# Patient Record
Sex: Female | Born: 1990
Health system: Southern US, Community
[De-identification: ages and names within clinical notes are randomized; demographics above are authoritative.]

## PROBLEM LIST (undated history)

## (undated) DIAGNOSIS — E282 Polycystic ovarian syndrome: Secondary | ICD-10-CM

## (undated) DIAGNOSIS — B029 Zoster without complications: Secondary | ICD-10-CM

## (undated) DIAGNOSIS — J45909 Unspecified asthma, uncomplicated: Secondary | ICD-10-CM

## (undated) DIAGNOSIS — F419 Anxiety disorder, unspecified: Secondary | ICD-10-CM

## (undated) DIAGNOSIS — G932 Benign intracranial hypertension: Secondary | ICD-10-CM

## (undated) DIAGNOSIS — F32A Depression, unspecified: Secondary | ICD-10-CM

## (undated) DIAGNOSIS — T7840XA Allergy, unspecified, initial encounter: Secondary | ICD-10-CM

## (undated) DIAGNOSIS — G4730 Sleep apnea, unspecified: Secondary | ICD-10-CM

## (undated) HISTORY — DX: Unspecified asthma, uncomplicated: J45.909

## (undated) HISTORY — DX: Sleep apnea, unspecified: G47.30

## (undated) HISTORY — PX: LUMBAR PUNCTURE: SHX1985

## (undated) HISTORY — PX: OTHER SURGICAL HISTORY: SHX169

## (undated) HISTORY — DX: Zoster without complications: B02.9

## (undated) HISTORY — DX: Polycystic ovarian syndrome: E28.2

## (undated) HISTORY — DX: Allergy, unspecified, initial encounter: T78.40XA

---

## 2013-05-02 ENCOUNTER — Ambulatory Visit (INDEPENDENT_AMBULATORY_CARE_PROVIDER_SITE_OTHER): Payer: BC Managed Care – PPO | Admitting: Podiatry

## 2013-05-02 ENCOUNTER — Ambulatory Visit (INDEPENDENT_AMBULATORY_CARE_PROVIDER_SITE_OTHER): Payer: BC Managed Care – PPO

## 2013-05-02 ENCOUNTER — Encounter: Payer: Self-pay | Admitting: Podiatry

## 2013-05-02 VITALS — BP 92/64 | HR 82 | Resp 16 | Ht 69.0 in | Wt 220.0 lb

## 2013-05-02 DIAGNOSIS — R52 Pain, unspecified: Secondary | ICD-10-CM

## 2013-05-02 MED ORDER — MELOXICAM 7.5 MG PO TABS: 7.5000 mg | ORAL_TABLET | Freq: Every day | ORAL | Status: DC

## 2013-05-02 MED ORDER — METHYLPREDNISOLONE (PAK) 4 MG PO TABS: ORAL_TABLET | ORAL | Status: DC

## 2013-05-02 NOTE — Patient Instructions (Addendum)
Plantar Fasciitis (Heel Spur Syndrome) with Rehab The plantar fascia is a fibrous, ligament-like, soft-tissue structure that spans the bottom of the foot. Plantar fasciitis is a condition that causes pain in the foot due to inflammation of the tissue. SYMPTOMS   Pain and tenderness on the underneath side of the foot.  Pain that worsens with standing or walking. CAUSES  Plantar fasciitis is caused by irritation and injury to the plantar fascia on the underneath side of the foot. Common mechanisms of injury include:  Direct trauma to bottom of the foot.  Damage to a small nerve that runs under the foot where the main fascia attaches to the heel bone.  Stress placed on the plantar fascia due to bone spurs. RISK INCREASES WITH:   Activities that place stress on the plantar fascia (running, jumping, pivoting, or cutting).  Poor strength and flexibility.  Improperly fitted shoes.  Tight calf muscles.  Flat feet.  Failure to warm-up properly before activity.  Obesity. PREVENTION  Warm up and stretch properly before activity.  Allow for adequate recovery between workouts.  Maintain physical fitness:  Strength, flexibility, and endurance.  Cardiovascular fitness.  Maintain a health body weight.  Avoid stress on the plantar fascia.  Wear properly fitted shoes, including arch supports for individuals who have flat feet. PROGNOSIS  If treated properly, then the symptoms of plantar fasciitis usually resolve without surgery. However, occasionally surgery is necessary. RELATED COMPLICATIONS   Recurrent symptoms that may result in a chronic condition.  Problems of the lower back that are caused by compensating for the injury, such as limping.  Pain or weakness of the foot during push-off following surgery.  Chronic inflammation, scarring, and partial or complete fascia tear, occurring more often from repeated injections. TREATMENT  Treatment initially involves the use of  ice and medication to help reduce pain and inflammation. The use of strengthening and stretching exercises may help reduce pain with activity, especially stretches of the Achilles tendon. These exercises may be performed at home or with a therapist. Your caregiver may recommend that you use heel cups of arch supports to help reduce stress on the plantar fascia. Occasionally, corticosteroid injections are given to reduce inflammation. If symptoms persist for greater than 6 months despite non-surgical (conservative), then surgery may be recommended.  MEDICATION   If pain medication is necessary, then nonsteroidal anti-inflammatory medications, such as aspirin and ibuprofen, or other minor pain relievers, such as acetaminophen, are often recommended.  Do not take pain medication within 7 days before surgery.  Prescription pain relievers may be given if deemed necessary by your caregiver. Use only as directed and only as much as you need.  Corticosteroid injections may be given by your caregiver. These injections should be reserved for the most serious cases, because they may only be given a certain number of times. HEAT AND COLD  Cold treatment (icing) relieves pain and reduces inflammation. Cold treatment should be applied for 10 to 15 minutes every 2 to 3 hours for inflammation and pain and immediately after any activity that aggravates your symptoms. Use ice packs or massage the area with a piece of ice (ice massage).  Heat treatment may be used prior to performing the stretching and strengthening activities prescribed by your caregiver, physical therapist, or athletic trainer. Use a heat pack or soak the injury in warm water. SEEK IMMEDIATE MEDICAL CARE IF:  Treatment seems to offer no benefit, or the condition worsens.  Any medications produce adverse side effects. EXERCISES RANGE   OF MOTION (ROM) AND STRETCHING EXERCISES - Plantar Fasciitis (Heel Spur Syndrome) These exercises may help you  when beginning to rehabilitate your injury. Your symptoms may resolve with or without further involvement from your physician, physical therapist or athletic trainer. While completing these exercises, remember:   Restoring tissue flexibility helps normal motion to return to the joints. This allows healthier, less painful movement and activity.  An effective stretch should be held for at least 30 seconds.  A stretch should never be painful. You should only feel a gentle lengthening or release in the stretched tissue. RANGE OF MOTION - Toe Extension, Flexion  Sit with your right / left leg crossed over your opposite knee.  Grasp your toes and gently pull them back toward the top of your foot. You should feel a stretch on the bottom of your toes and/or foot.  Hold this stretch for __________ seconds.  Now, gently pull your toes toward the bottom of your foot. You should feel a stretch on the top of your toes and or foot.  Hold this stretch for __________ seconds. Repeat __________ times. Complete this stretch __________ times per day.  RANGE OF MOTION - Ankle Dorsiflexion, Active Assisted  Remove shoes and sit on a chair that is preferably not on a carpeted surface.  Place right / left foot under knee. Extend your opposite leg for support.  Keeping your heel down, slide your right / left foot back toward the chair until you feel a stretch at your ankle or calf. If you do not feel a stretch, slide your bottom forward to the edge of the chair, while still keeping your heel down.  Hold this stretch for __________ seconds. Repeat __________ times. Complete this stretch __________ times per day.  STRETCH  Gastroc, Standing  Place hands on wall.  Extend right / left leg, keeping the front knee somewhat bent.  Slightly point your toes inward on your back foot.  Keeping your right / left heel on the floor and your knee straight, shift your weight toward the wall, not allowing your back to  arch.  You should feel a gentle stretch in the right / left calf. Hold this position for __________ seconds. Repeat __________ times. Complete this stretch __________ times per day. STRETCH  Soleus, Standing  Place hands on wall.  Extend right / left leg, keeping the other knee somewhat bent.  Slightly point your toes inward on your back foot.  Keep your right / left heel on the floor, bend your back knee, and slightly shift your weight over the back leg so that you feel a gentle stretch deep in your back calf.  Hold this position for __________ seconds. Repeat __________ times. Complete this stretch __________ times per day. STRETCH  Gastrocsoleus, Standing  Note: This exercise can place a lot of stress on your foot and ankle. Please complete this exercise only if specifically instructed by your caregiver.   Place the ball of your right / left foot on a step, keeping your other foot firmly on the same step.  Hold on to the wall or a rail for balance.  Slowly lift your other foot, allowing your body weight to press your heel down over the edge of the step.  You should feel a stretch in your right / left calf.  Hold this position for __________ seconds.  Repeat this exercise with a slight bend in your right / left knee. Repeat __________ times. Complete this stretch __________ times per day.    STRENGTHENING EXERCISES - Plantar Fasciitis (Heel Spur Syndrome)  These exercises may help you when beginning to rehabilitate your injury. They may resolve your symptoms with or without further involvement from your physician, physical therapist or athletic trainer. While completing these exercises, remember:   Muscles can gain both the endurance and the strength needed for everyday activities through controlled exercises.  Complete these exercises as instructed by your physician, physical therapist or athletic trainer. Progress the resistance and repetitions only as guided. STRENGTH - Towel  Curls  Sit in a chair positioned on a non-carpeted surface.  Place your foot on a towel, keeping your heel on the floor.  Pull the towel toward your heel by only curling your toes. Keep your heel on the floor.  If instructed by your physician, physical therapist or athletic trainer, add ____________________ at the end of the towel. Repeat __________ times. Complete this exercise __________ times per day. STRENGTH - Ankle Inversion  Secure one end of a rubber exercise band/tubing to a fixed object (table, pole). Loop the other end around your foot just before your toes.  Place your fists between your knees. This will focus your strengthening at your ankle.  Slowly, pull your big toe up and in, making sure the band/tubing is positioned to resist the entire motion.  Hold this position for __________ seconds.  Have your muscles resist the band/tubing as it slowly pulls your foot back to the starting position. Repeat __________ times. Complete this exercises __________ times per day.  Document Released: 07/14/2005 Document Revised: 10/06/2011 Document Reviewed: 10/26/2008 [employer] Patient Information 2014 [employer]. Plantar Fasciitis Plantar fasciitis is a common condition that causes foot pain. It is soreness (inflammation) of the band of tough fibrous tissue on the bottom of the foot that runs from the heel bone (calcaneus) to the ball of the foot. The cause of this soreness may be from excessive standing, poor fitting shoes, running on hard surfaces, being overweight, having an abnormal walk, or overuse (this is common in runners) of the painful foot or feet. It is also common in aerobic exercise dancers and ballet dancers. SYMPTOMS  Most people with plantar fasciitis complain of:  Severe pain in the morning on the bottom of their foot especially when taking the first steps out of bed. This pain recedes after a few minutes of walking.  Severe pain is experienced also during walking  following a long period of inactivity.  Pain is worse when walking barefoot or up stairs DIAGNOSIS   Your caregiver will diagnose this condition by examining and feeling your foot.  Special tests such as X-rays of your foot, are usually not needed. PREVENTION   Consult a sports medicine professional before beginning a new exercise program.  Walking programs offer a good workout. With walking there is a lower chance of overuse injuries common to runners. There is less impact and less jarring of the joints.  Begin all new exercise programs slowly. If problems or pain develop, decrease the amount of time or distance until you are at a comfortable level.  Wear good shoes and replace them regularly.  Stretch your foot and the heel cords at the back of the ankle (Achilles tendon) both before and after exercise.  Run or exercise on even surfaces that are not hard. For example, asphalt is better than pavement.  Do not run barefoot on hard surfaces.  If using a treadmill, vary the incline.  Do not continue to workout if you have foot or joint   problems. Seek professional help if they do not improve. HOME CARE INSTRUCTIONS   Avoid activities that cause you pain until you recover.  Use ice or cold packs on the problem or painful areas after working out.  Only take over-the-counter or prescription medicines for pain, discomfort, or fever as directed by your caregiver.  Soft shoe inserts or athletic shoes with air or gel sole cushions may be helpful.  If problems continue or become more severe, consult a sports medicine caregiver or your own health care provider. Cortisone is a potent anti-inflammatory medication that may be injected into the painful area. You can discuss this treatment with your caregiver. MAKE SURE YOU:   Understand these instructions.  Will watch your condition.  Will get help right away if you are not doing well or get worse. Document Released: 04/08/2001 Document  Revised: 10/06/2011 Document Reviewed: 06/07/2008 [employer] Patient Information 2014 [employer].  

## 2013-05-02 NOTE — Progress Notes (Signed)
Kharizma presents today with a chief complaint of a painful left foot. States it is primarily in the second metatarsophalangeal joint area and as of late has radiated proximally to the left heel. States this occurred in late August, after having pain in her right foot at that time. Most recently her pain is primarily in the left heel at about the second metatarsophalangeal joint area. Denies any trauma to the left foot though she does admit to a contusion to the right foot. 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  Objective: I have reviewed her past medical history medications and allergies. Vascular status is intact with pulses palpable to the bilateral lower extremities, capillary fill time is immediate, no edema, no ecchymosis. Neurologic sensorium is intact grossly. Deep tendon reflexes are intact. Muscle strength is intact +5 over 5 dorsiflexors plantar flexors inverters everters and all intrinsic musculature. Orthopedic evaluation demonstrates all joints distal to the ankle, full range of motion without crepitation. She has pain on palpation and in range of motion of the second metatarsophalangeal joint left foot. No pain on palpation of the metatarsal. She does have pain on palpation of the plantar fascia at the plantar fascial calcaneal insertion site. Radiographic evaluation demonstrates all joints appear to be normal normal osseous architecture is present. Soft tissue increase in density does demonstrate some fluid retention indicators triangle and an anterior ankle area and some soft tissue edema is also present. She does have soft tissue increase in density at the plantar fat fascial calcaneal insertion site. Dermatologic evaluation demonstrates supple while hydrated cutis, no erythema edema saline is drainage or odor no preoperative lesions.  Assessment: Plantar fasciitis left, possibly compensatory from the  right foot trauma. Capsulitis of the second metatarsophalangeal joint left. Mild peroneal tendinopathy left.  Plan: Injected the left heel today with Kenalog and local anesthetic to the point of maximal tenderness left heel. Plantar fascial strapping was applied. We dispensed a night splint. Discussed appropriate shoe gear stretching exercises ice therapy and shoe gear modifications. Instructions first stretching were given. A prescription for Medrol dose pack to be followed by [facility]. And I will followup with her in one month. Due to her activities I think orthotics might be best for her.

## 2013-05-02 NOTE — Progress Notes (Signed)
N dull ache to sharp pain  L left 2/3 met to across top of foot to arch to heel pain D end of august O all of sudden  C little better  A anytime T ice soaks

## 2014-01-18 DIAGNOSIS — M722 Plantar fascial fibromatosis: Secondary | ICD-10-CM

## 2014-01-20 ENCOUNTER — Ambulatory Visit (INDEPENDENT_AMBULATORY_CARE_PROVIDER_SITE_OTHER): Payer: Managed Care, Other (non HMO) | Admitting: Podiatry

## 2014-01-20 ENCOUNTER — Encounter: Payer: Self-pay | Admitting: Podiatry

## 2014-01-20 VITALS — BP 113/66 | HR 85 | Resp 16

## 2014-01-20 DIAGNOSIS — M722 Plantar fascial fibromatosis: Secondary | ICD-10-CM

## 2014-01-20 DIAGNOSIS — M7750 Other enthesopathy of unspecified foot: Secondary | ICD-10-CM

## 2014-01-20 DIAGNOSIS — M069 Rheumatoid arthritis, unspecified: Secondary | ICD-10-CM

## 2014-01-21 LAB — RHEUMATOID FACTOR: RHEUMATOID FACTOR: 7.7 [IU]/mL (ref 0.0–13.9)

## 2014-01-21 LAB — URIC ACID: Uric Acid: 4.2 mg/dL (ref 2.5–7.1)

## 2014-01-21 LAB — ANA: Anti Nuclear Antibody(ANA): POSITIVE — AB

## 2014-01-21 LAB — C-REACTIVE PROTEIN: CRP: 16.3 mg/L — AB (ref 0.0–4.9)

## 2014-01-21 LAB — SEDIMENTATION RATE: SED RATE: 3 mm/h (ref 0–32)

## 2014-01-21 NOTE — Progress Notes (Signed)
Subjective:     Patient ID: Carol Porter, female   DOB: August 20, 1990, 23 y.o.   MRN: [id number]  HPI patient states that her arches will flareup and get better and flareup to get better and she's not sure the problem and also seems to have other problems   Review of Systems     Objective:   Physical Exam Neurovascular status intact with moderate discomfort in the plantar fascia of both feet but no one specific area of pain with a wide distribution noted    Assessment:     Possibility for some form of plantar fasciitis versus the possibility for some form of systemic issue causing this condition    Plan:     H&P discussed and I do think long-term she can require orthotics with scans done today. I then went ahead and I am sending for blood work to rule out systemic pathology

## 2014-03-07 ENCOUNTER — Telehealth: Payer: Self-pay | Admitting: *Deleted

## 2014-03-07 NOTE — Telephone Encounter (Signed)
I never received my results from my last visit.  If I could have those mailed to me at my address that would be great.

## 2014-03-09 NOTE — Telephone Encounter (Signed)
Mailed labs to patient.

## 2014-03-14 ENCOUNTER — Ambulatory Visit (INDEPENDENT_AMBULATORY_CARE_PROVIDER_SITE_OTHER): Payer: 59 | Admitting: Podiatry

## 2014-03-14 VITALS — BP 126/72 | HR 75 | Resp 16

## 2014-03-14 DIAGNOSIS — M069 Rheumatoid arthritis, unspecified: Secondary | ICD-10-CM

## 2014-03-14 DIAGNOSIS — M7750 Other enthesopathy of unspecified foot: Secondary | ICD-10-CM

## 2014-03-15 NOTE — Progress Notes (Signed)
Subjective:     Patient ID: Carol Porter, female   DOB: 06-Dec-1990, 23 y.o.   MRN: [id number]  HPI patient states my foot feels some better but I'm here to review lab work and why I have so many different areas of discomfort   Review of Systems     Objective:   Physical Exam Neurovascular status intact with improvement within the foot noted currently with indications on blood work that there may be an inflammatory process    Assessment:     Explained the elevation of ANA and C-reactive protein and I would like her to see a rheumatologist with possibility for systemic inflammation    Plan:     She is to see Dr. and we will reevaluate her if pain persists. Did scanned for orthotics today to try to reduce plantar pain

## 2014-03-24 ENCOUNTER — Telehealth: Payer: Self-pay | Admitting: *Deleted

## 2014-03-24 NOTE — Telephone Encounter (Signed)
Pt called wanting lab results faxed over to her family doctor. Shelly faxed them to [phone].

## 2014-03-24 NOTE — Telephone Encounter (Signed)
I'm about to go see my primary care doctor and would like my lab values from my recent blood work.  It's the one that shows the RA.  If you could read off the results to me in a voicemail, if you can call me back that would be great.

## 2014-03-27 ENCOUNTER — Telehealth: Payer: Self-pay | Admitting: Podiatry

## 2014-03-27 NOTE — Telephone Encounter (Signed)
Notified patient that orthotics are in and scheduled appointment.

## 2014-03-31 ENCOUNTER — Ambulatory Visit (INDEPENDENT_AMBULATORY_CARE_PROVIDER_SITE_OTHER): Payer: 59 | Admitting: *Deleted

## 2014-03-31 DIAGNOSIS — M722 Plantar fascial fibromatosis: Secondary | ICD-10-CM

## 2014-03-31 NOTE — Patient Instructions (Signed)

## 2014-03-31 NOTE — Progress Notes (Signed)
Pt presents for orthotic pick up written and verbal instructions are giving

## 2014-05-05 ENCOUNTER — Ambulatory Visit: Payer: 59 | Admitting: Podiatry

## 2014-05-12 DIAGNOSIS — Z8719 Personal history of other diseases of the digestive system: Secondary | ICD-10-CM | POA: Insufficient documentation

## 2014-05-12 DIAGNOSIS — R1013 Epigastric pain: Secondary | ICD-10-CM | POA: Insufficient documentation

## 2014-06-02 ENCOUNTER — Ambulatory Visit: Payer: Self-pay | Admitting: Unknown Physician Specialty

## 2014-06-30 DIAGNOSIS — G43119 Migraine with aura, intractable, without status migrainosus: Secondary | ICD-10-CM | POA: Insufficient documentation

## 2014-11-20 LAB — SURGICAL PATHOLOGY

## 2015-08-03 DIAGNOSIS — F331 Major depressive disorder, recurrent, moderate: Secondary | ICD-10-CM | POA: Insufficient documentation

## 2015-08-03 DIAGNOSIS — K649 Unspecified hemorrhoids: Secondary | ICD-10-CM | POA: Insufficient documentation

## 2015-08-03 DIAGNOSIS — F419 Anxiety disorder, unspecified: Secondary | ICD-10-CM | POA: Insufficient documentation

## 2015-08-31 DIAGNOSIS — F5089 Other specified eating disorder: Secondary | ICD-10-CM | POA: Insufficient documentation

## 2015-10-22 DIAGNOSIS — F900 Attention-deficit hyperactivity disorder, predominantly inattentive type: Secondary | ICD-10-CM | POA: Insufficient documentation

## 2016-05-20 DIAGNOSIS — J4520 Mild intermittent asthma, uncomplicated: Secondary | ICD-10-CM | POA: Insufficient documentation

## 2017-10-01 ENCOUNTER — Other Ambulatory Visit: Payer: Self-pay

## 2017-10-01 ENCOUNTER — Emergency Department
Admission: EM | Admit: 2017-10-01 | Discharge: 2017-10-01 | Disposition: A | Discharge status: Home / Self Care | Payer: 59 | Attending: Emergency Medicine | Admitting: Emergency Medicine

## 2017-10-01 ENCOUNTER — Emergency Department: Payer: 59

## 2017-10-01 ENCOUNTER — Encounter: Payer: Self-pay | Admitting: Emergency Medicine

## 2017-10-01 DIAGNOSIS — R002 Palpitations: Secondary | ICD-10-CM | POA: Insufficient documentation

## 2017-10-01 DIAGNOSIS — R0602 Shortness of breath: Secondary | ICD-10-CM | POA: Insufficient documentation

## 2017-10-01 DIAGNOSIS — R42 Dizziness and giddiness: Secondary | ICD-10-CM | POA: Insufficient documentation

## 2017-10-01 DIAGNOSIS — R5383 Other fatigue: Secondary | ICD-10-CM

## 2017-10-01 DIAGNOSIS — R509 Fever, unspecified: Secondary | ICD-10-CM | POA: Diagnosis not present

## 2017-10-01 DIAGNOSIS — Z79899 Other long term (current) drug therapy: Secondary | ICD-10-CM | POA: Insufficient documentation

## 2017-10-01 DIAGNOSIS — J45909 Unspecified asthma, uncomplicated: Secondary | ICD-10-CM | POA: Insufficient documentation

## 2017-10-01 LAB — CBC WITH DIFFERENTIAL/PLATELET
BASOS ABS: 0 10*3/uL (ref 0–0.1)
BASOS PCT: 0 %
EOS ABS: 0.1 10*3/uL (ref 0–0.7)
EOS PCT: 1 %
HCT: 35.8 % (ref 35.0–47.0)
HEMOGLOBIN: 11.9 g/dL — AB (ref 12.0–16.0)
Lymphocytes Relative: 13 %
Lymphs Abs: 1.6 10*3/uL (ref 1.0–3.6)
MCH: 28.9 pg (ref 26.0–34.0)
MCHC: 33.3 g/dL (ref 32.0–36.0)
MCV: 86.7 fL (ref 80.0–100.0)
Monocytes Absolute: 0.9 10*3/uL (ref 0.2–0.9)
Monocytes Relative: 7 %
NEUTROS PCT: 79 %
Neutro Abs: 9.7 10*3/uL — ABNORMAL HIGH (ref 1.4–6.5)
PLATELETS: 257 10*3/uL (ref 150–440)
RBC: 4.13 MIL/uL (ref 3.80–5.20)
RDW: 13.5 % (ref 11.5–14.5)
WBC: 12.3 10*3/uL — ABNORMAL HIGH (ref 3.6–11.0)

## 2017-10-01 LAB — COMPREHENSIVE METABOLIC PANEL
ALT: 20 U/L (ref 14–54)
AST: 21 U/L (ref 15–41)
Albumin: 3.6 g/dL (ref 3.5–5.0)
Alkaline Phosphatase: 60 U/L (ref 38–126)
Anion gap: 11 (ref 5–15)
BILIRUBIN TOTAL: 0.5 mg/dL (ref 0.3–1.2)
BUN: 12 mg/dL (ref 6–20)
CHLORIDE: 103 mmol/L (ref 101–111)
CO2: 22 mmol/L (ref 22–32)
CREATININE: 0.72 mg/dL (ref 0.44–1.00)
Calcium: 8.4 mg/dL — ABNORMAL LOW (ref 8.9–10.3)
Glucose, Bld: 113 mg/dL — ABNORMAL HIGH (ref 65–99)
Potassium: 3.5 mmol/L (ref 3.5–5.1)
Sodium: 136 mmol/L (ref 135–145)
TOTAL PROTEIN: 7.3 g/dL (ref 6.5–8.1)

## 2017-10-01 LAB — URINALYSIS, COMPLETE (UACMP) WITH MICROSCOPIC
BILIRUBIN URINE: NEGATIVE
GLUCOSE, UA: NEGATIVE mg/dL
KETONES UR: NEGATIVE mg/dL
NITRITE: NEGATIVE
PH: 6 (ref 5.0–8.0)
Protein, ur: NEGATIVE mg/dL
SPECIFIC GRAVITY, URINE: 1.006 (ref 1.005–1.030)

## 2017-10-01 LAB — T4, FREE: FREE T4: 0.76 ng/dL (ref 0.61–1.12)

## 2017-10-01 LAB — TSH: TSH: 2.565 u[IU]/mL (ref 0.350–4.500)

## 2017-10-01 LAB — POCT PREGNANCY, URINE: Preg Test, Ur: NEGATIVE

## 2017-10-01 MED ORDER — SODIUM CHLORIDE 0.9 % IV BOLUS (SEPSIS)
1000.0000 mL | Freq: Once | INTRAVENOUS | Status: AC
  Administered 2017-10-01: 1000 mL via INTRAVENOUS

## 2017-10-01 NOTE — ED Notes (Signed)
Pt given soda at this time. Pt was able to get up to the bathroom without assistance. HR stayed WNL.

## 2017-10-01 NOTE — ED Triage Notes (Signed)
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 She thought that she may be starting to get a migraine and took some Ibuprofen. She than took her pulse and seen it was going from 70's to 100's. She was going to go to urgent care and but then felt really sick. When EMS arrived she was flushed they reported and her HR was in the 150's. It has since come down.

## 2017-10-01 NOTE — ED Notes (Signed)
Pt ambulatory to POV without difficulty. VSS, NAD. Discharge instructions, and follow up discussed. All questions answered.

## 2017-10-01 NOTE — ED Provider Notes (Signed)
[facility] Emergency Department Provider Note  ____________________________________________   First MD Initiated Contact with Patient 10/01/17 1318     (approximate)  I have reviewed the triage vital signs and the nursing notes.   HISTORY  Chief Complaint Tachycardia   HPI Carol Porter is a 27 y.o. female who presents the emergency department via EMS after an episode of palpitations and lightheadedness while at work.  The patient is a primary care physician's assistant and today at work she began to feel "not right" and felt her heart race.  She checked her heart rate on the pulse oximeter and it got up to 157 so she called 911.  She has had a low-grade fever recently.  No cough.  No shortness of breath.  She has been more fatigued than usual and is concerned that she might have thyroid issues.  Her symptoms began suddenly lasted 15 minutes and resolved quickly on their own.  She did have some chest pain shortness of breath during that episode.  She has had multiple episodes like this in the past although not recently.  She has no family history of early sudden cardiac death.  She is currently asymptomatic.  Past Medical History:  Diagnosis Date  . Allergy   . Asthma     There are no active problems to display for this patient.   History reviewed. No pertinent surgical history.  Prior to Admission medications   Medication Sig Start Date End Date Taking? Authorizing Provider  Cholecalciferol (VITAMIN D) 2000 units CAPS Take 2,000 Units by mouth daily.   Yes [provider]  cyanocobalamin 1000 MCG tablet Take 1,000 mcg by mouth daily.   Yes [provider]  magnesium oxide (MAG-OX) 400 MG tablet Take 400 mg by mouth daily.   Yes [provider]  ranitidine (ZANTAC) 75 MG tablet Take 75 mg by mouth as needed.    Yes [provider]  SYEDA 3-0.03 MG tablet Take 1 tablet by mouth daily. 09/30/17  Yes [provider]   busPIRone (BUSPAR) 5 MG tablet Take 5 mg by mouth 2 (two) times daily. 09/30/17   [provider]  sucralfate (CARAFATE) 1 g tablet Take 1 tablet by mouth 4 (four) times daily. 09/30/17   [provider]  TRINTELLIX 5 MG TABS tablet Take 5 mg by mouth daily. 09/30/17   [provider]    Allergies Flagyl [metronidazole]; Oxycodone-acetaminophen; and Phentermine  History reviewed. No pertinent family history.  Social History Social History   Tobacco Use  . Smoking status: Never Smoker  . Smokeless tobacco: Never Used  Substance Use Topics  . Alcohol use: Yes    Comment: social  . Drug use: No    Review of Systems Constitutional: No fever/chills Eyes: No visual changes. ENT: No sore throat. Cardiovascular: Positive for chest pain. Respiratory: Positive for shortness of breath. Gastrointestinal: No abdominal pain.  No nausea, no vomiting.  No diarrhea.  No constipation. Genitourinary: Negative for dysuria. Musculoskeletal: Negative for back pain. Skin: Negative for rash. Neurological: Negative for headaches, focal weakness or numbness.   ____________________________________________   PHYSICAL EXAM:  VITAL SIGNS: ED Triage Vitals [10/01/17 1314]  Enc Vitals Group     BP (!) 132/57     Pulse Rate 100     Resp 18     Temp 98.2 F (36.8 C)     Temp Source Oral     SpO2 100 %     Weight  Height      Head Circumference      Peak Flow      Pain Score      Pain Loc      Pain Edu?      Excl. in [place]?     Constitutional: Alert and oriented x4 somewhat anxious appearing nontoxic no diaphoresis speaks in full clear sentences Eyes: PERRL EOMI. Head: Atraumatic. Nose: No congestion/rhinnorhea. Mouth/Throat: No trismus Neck: No stridor.  Able lie completely flat with no JVD normal thyroid nonpalpable not warm Cardiovascular: Tachycardic rate, regular rhythm. Grossly normal heart sounds.  Good peripheral circulation. Respiratory: Normal  respiratory effort.  No retractions. Lungs CTAB and moving good air Gastrointestinal: Obese soft nontender Musculoskeletal: No lower extremity edema legs are equal in size Neurologic:  Normal speech and language. No gross focal neurologic deficits are appreciated. Skin:  Skin is warm, dry and intact. No rash noted. Psychiatric: Somewhat anxious appearing    ____________________________________________   DIFFERENTIAL includes but not limited to  Myxedema, SVT, atrial fibrillation, ventricular tachycardia, dehydration, anxiety ____________________________________________   LABS (all labs ordered are listed, but only abnormal results are displayed)  Labs Reviewed  COMPREHENSIVE METABOLIC PANEL - Abnormal; Notable for the following components:      Result Value   Glucose, Bld 113 (*)    Calcium 8.4 (*)    All other components within normal limits  CBC WITH DIFFERENTIAL/PLATELET - Abnormal; Notable for the following components:   WBC 12.3 (*)    Hemoglobin 11.9 (*)    Neutro Abs 9.7 (*)    All other components within normal limits  URINALYSIS, COMPLETE (UACMP) WITH MICROSCOPIC - Abnormal; Notable for the following components:   Color, Urine YELLOW (*)    APPearance HAZY (*)    Hgb urine dipstick SMALL (*)    Leukocytes, UA SMALL (*)    Bacteria, UA RARE (*)    Squamous Epithelial / LPF 0-5 (*)    All other components within normal limits  TSH  T4, FREE  POCT PREGNANCY, URINE    Lab work reviewed by me with elevated white count which is nonspecific __________________________________________  EKG  ED ECG REPORT I, Darel Hong, the attending physician, personally viewed and interpreted this ECG.  Date: 10/01/2017 EKG Time:  Rate: 102 Rhythm: Sinus tachycardia QRS Axis: normal Intervals: normal ST/T Wave abnormalities: normal Narrative Interpretation: no evidence of acute ischemia  ____________________________________________  RADIOLOGY  Chest x-ray reviewed  by me with no acute disease ____________________________________________   PROCEDURES  Procedure(s) performed: no  Procedures  Critical Care performed: o  Observation: no ____________________________________________   INITIAL IMPRESSION / ASSESSMENT AND PLAN / ED COURSE  Pertinent labs & imaging results that were available during my care of the patient were reviewed by me and considered in my medical decision making (see chart for details).  Patient arrives slightly tachycardic but with otherwise unremarkable exam.  Blood work is reassuring and she has normal thyroid function test.  Chest x-ray is unremarkable.  EKG with no concerning signs for cardiogenic syncope.  She has been on the monitor several hours with no ectopy.  I had a lengthy discussion with the patient and her husband at bedside regarding the diagnostic uncertainty and the importance of following up with primary care this week for reevaluation.  Strict return precautions have been given to the patient verbalized understanding and agreement with plan.      ____________________________________________   FINAL CLINICAL IMPRESSION(S) / ED DIAGNOSES  Final diagnoses:  Palpitations  Fatigue, unspecified type      NEW MEDICATIONS STARTED DURING THIS VISIT:  New Prescriptions   No medications on file     Note:  This document was prepared using Dragon voice recognition software and may include unintentional dictation errors.     Darel Hong, MD 10/01/17 1527

## 2017-10-01 NOTE — Discharge Instructions (Signed)
Fortunately today your blood work, EKG, and chest x-ray were reassuring.  Please make sure you remain well-hydrated and follow-up with your primary care physician this coming week for reevaluation.  It was a pleasure to take care of you today, and thank you for coming to our emergency department.  If you have any questions or concerns before leaving please ask the nurse to grab me and I'm more than happy to go through your aftercare instructions again.  If you were prescribed any opioid pain medication today such as Norco, Vicodin, Percocet, morphine, hydrocodone, or oxycodone please make sure you do not drive when you are taking this medication as it can alter your ability to drive safely.  If you have any concerns once you are home that you are not improving or are in fact getting worse before you can make it to your follow-up appointment, please do not hesitate to call 911 and come back for further evaluation.  Darel Hong, MD  Results for orders placed or performed during the hospital encounter of 10/01/17  Comprehensive metabolic panel  Result Value Ref Range   Sodium 136 135 - 145 mmol/L   Potassium 3.5 3.5 - 5.1 mmol/L   Chloride 103 101 - 111 mmol/L   CO2 22 22 - 32 mmol/L   Glucose, Bld 113 (H) 65 - 99 mg/dL   BUN 12 6 - 20 mg/dL   Creatinine, Ser 0.72 0.44 - 1.00 mg/dL   Calcium 8.4 (L) 8.9 - 10.3 mg/dL   Total Protein 7.3 6.5 - 8.1 g/dL   Albumin 3.6 3.5 - 5.0 g/dL   AST 21 15 - 41 U/L   ALT 20 14 - 54 U/L   Alkaline Phosphatase 60 38 - 126 U/L   Total Bilirubin 0.5 0.3 - 1.2 mg/dL   GFR calc non Af Amer >60 >60 mL/min   GFR calc Af Amer >60 >60 mL/min   Anion gap 11 5 - 15  CBC with Differential  Result Value Ref Range   WBC 12.3 (H) 3.6 - 11.0 K/uL   RBC 4.13 3.80 - 5.20 MIL/uL   Hemoglobin 11.9 (L) 12.0 - 16.0 g/dL   HCT 35.8 35.0 - 47.0 %   MCV 86.7 80.0 - 100.0 fL   MCH 28.9 26.0 - 34.0 pg   MCHC 33.3 32.0 - 36.0 g/dL   RDW 13.5 11.5 - 14.5 %   Platelets 257  150 - 440 K/uL   Neutrophils Relative % 79 %   Neutro Abs 9.7 (H) 1.4 - 6.5 K/uL   Lymphocytes Relative 13 %   Lymphs Abs 1.6 1.0 - 3.6 K/uL   Monocytes Relative 7 %   Monocytes Absolute 0.9 0.2 - 0.9 K/uL   Eosinophils Relative 1 %   Eosinophils Absolute 0.1 0 - 0.7 K/uL   Basophils Relative 0 %   Basophils Absolute 0.0 0 - 0.1 K/uL  TSH  Result Value Ref Range   TSH 2.565 0.350 - 4.500 uIU/mL  T4, free  Result Value Ref Range   Free T4 0.76 0.61 - 1.12 ng/dL  Urinalysis, Complete w Microscopic  Result Value Ref Range   Color, Urine YELLOW (A) YELLOW   APPearance HAZY (A) CLEAR   Specific Gravity, Urine 1.006 1.005 - 1.030   pH 6.0 5.0 - 8.0   Glucose, UA NEGATIVE NEGATIVE mg/dL   Hgb urine dipstick SMALL (A) NEGATIVE   Bilirubin Urine NEGATIVE NEGATIVE   Ketones, ur NEGATIVE NEGATIVE mg/dL   Protein, ur NEGATIVE  NEGATIVE mg/dL   Nitrite NEGATIVE NEGATIVE   Leukocytes, UA SMALL (A) NEGATIVE   RBC / HPF 0-5 0 - 5 RBC/hpf   WBC, UA 0-5 0 - 5 WBC/hpf   Bacteria, UA RARE (A) NONE SEEN   Squamous Epithelial / LPF 0-5 (A) NONE SEEN  Pregnancy, urine POC  Result Value Ref Range   Preg Test, Ur NEGATIVE NEGATIVE   Dg Chest 2 View  Result Date: 10/01/2017 CLINICAL DATA:  Short of breath EXAM: CHEST - 2 VIEW COMPARISON:  None. FINDINGS: The heart size and mediastinal contours are within normal limits. Both lungs are clear. The visualized skeletal structures are unremarkable. IMPRESSION: No active cardiopulmonary disease. Electronically Signed   By: Franchot Gallo M.D.   On: 10/01/2017 14:57

## 2017-11-04 DIAGNOSIS — R000 Tachycardia, unspecified: Secondary | ICD-10-CM | POA: Insufficient documentation

## 2017-12-11 ENCOUNTER — Other Ambulatory Visit: Payer: Self-pay | Admitting: Internal Medicine

## 2017-12-11 DIAGNOSIS — R5383 Other fatigue: Secondary | ICD-10-CM

## 2017-12-11 DIAGNOSIS — R110 Nausea: Secondary | ICD-10-CM

## 2017-12-11 DIAGNOSIS — R5381 Other malaise: Secondary | ICD-10-CM

## 2017-12-11 DIAGNOSIS — R42 Dizziness and giddiness: Secondary | ICD-10-CM

## 2018-06-14 DIAGNOSIS — E282 Polycystic ovarian syndrome: Secondary | ICD-10-CM | POA: Insufficient documentation

## 2018-08-11 DIAGNOSIS — R768 Other specified abnormal immunological findings in serum: Secondary | ICD-10-CM | POA: Insufficient documentation

## 2018-11-17 DIAGNOSIS — G4733 Obstructive sleep apnea (adult) (pediatric): Secondary | ICD-10-CM | POA: Insufficient documentation

## 2018-11-17 DIAGNOSIS — Z9989 Dependence on other enabling machines and devices: Secondary | ICD-10-CM | POA: Insufficient documentation

## 2019-12-30 ENCOUNTER — Other Ambulatory Visit (HOSPITAL_COMMUNITY): Payer: Self-pay | Admitting: Family Medicine

## 2020-02-13 ENCOUNTER — Other Ambulatory Visit (HOSPITAL_COMMUNITY): Payer: Self-pay | Admitting: Neurology

## 2020-04-12 DIAGNOSIS — U099 Post covid-19 condition, unspecified: Secondary | ICD-10-CM | POA: Insufficient documentation

## 2020-05-19 DIAGNOSIS — G932 Benign intracranial hypertension: Secondary | ICD-10-CM | POA: Insufficient documentation

## 2020-09-14 MED FILL — acetaZOLAMIDE 250 MG TABS: 250 | 90 days supply | Qty: 180 | Fill #0

## 2020-09-14 MED FILL — FLUoxetine HCL 10 MG TABS: 10 | 60 days supply | Qty: 90 | Fill #0

## 2020-09-21 DIAGNOSIS — G4733 Obstructive sleep apnea (adult) (pediatric): Secondary | ICD-10-CM | POA: Diagnosis not present

## 2020-09-27 DIAGNOSIS — F411 Generalized anxiety disorder: Secondary | ICD-10-CM | POA: Diagnosis not present

## 2020-09-27 DIAGNOSIS — E282 Polycystic ovarian syndrome: Secondary | ICD-10-CM | POA: Diagnosis not present

## 2020-09-27 DIAGNOSIS — Z131 Encounter for screening for diabetes mellitus: Secondary | ICD-10-CM | POA: Diagnosis not present

## 2020-09-27 DIAGNOSIS — Z0000 Encounter for general adult medical examination without abnormal findings: Secondary | ICD-10-CM | POA: Diagnosis not present

## 2020-10-16 ENCOUNTER — Other Ambulatory Visit (HOSPITAL_BASED_OUTPATIENT_CLINIC_OR_DEPARTMENT_OTHER): Payer: Self-pay

## 2020-10-23 DIAGNOSIS — G43111 Migraine with aura, intractable, with status migrainosus: Secondary | ICD-10-CM | POA: Diagnosis not present

## 2020-11-07 ENCOUNTER — Encounter (HOSPITAL_COMMUNITY): Payer: Self-pay

## 2020-11-07 ENCOUNTER — Ambulatory Visit (INDEPENDENT_AMBULATORY_CARE_PROVIDER_SITE_OTHER): Payer: 59

## 2020-11-07 ENCOUNTER — Other Ambulatory Visit: Payer: Self-pay

## 2020-11-07 ENCOUNTER — Ambulatory Visit (HOSPITAL_COMMUNITY)
Admission: EM | Admit: 2020-11-07 | Discharge: 2020-11-07 | Disposition: A | Discharge status: Home / Self Care | Payer: 59 | Attending: Physician Assistant | Admitting: Physician Assistant

## 2020-11-07 ENCOUNTER — Ambulatory Visit (HOSPITAL_COMMUNITY): Admission: EM | Admit: 2020-11-07 | Discharge: 2020-11-07 | Discharge status: Absent without leave | Payer: Self-pay

## 2020-11-07 DIAGNOSIS — R059 Cough, unspecified: Secondary | ICD-10-CM | POA: Diagnosis not present

## 2020-11-07 DIAGNOSIS — R112 Nausea with vomiting, unspecified: Secondary | ICD-10-CM | POA: Diagnosis not present

## 2020-11-07 DIAGNOSIS — R197 Diarrhea, unspecified: Secondary | ICD-10-CM | POA: Diagnosis not present

## 2020-11-07 DIAGNOSIS — R1110 Vomiting, unspecified: Secondary | ICD-10-CM | POA: Diagnosis not present

## 2020-11-07 DIAGNOSIS — R0981 Nasal congestion: Secondary | ICD-10-CM | POA: Insufficient documentation

## 2020-11-07 HISTORY — DX: Benign intracranial hypertension: G93.2

## 2020-11-07 HISTORY — DX: Depression, unspecified: F32.A

## 2020-11-07 HISTORY — DX: Anxiety disorder, unspecified: F41.9

## 2020-11-07 LAB — COMPREHENSIVE METABOLIC PANEL
ALT: 16 U/L (ref 0–44)
AST: 16 U/L (ref 15–41)
Albumin: 4.1 g/dL (ref 3.5–5.0)
Alkaline Phosphatase: 79 U/L (ref 38–126)
Anion gap: 7 (ref 5–15)
BUN: 13 mg/dL (ref 6–20)
CO2: 24 mmol/L (ref 22–32)
Calcium: 9.5 mg/dL (ref 8.9–10.3)
Chloride: 107 mmol/L (ref 98–111)
Creatinine, Ser: 0.84 mg/dL (ref 0.44–1.00)
GFR, Estimated: >60 mL/min (ref >60)
Glucose, Bld: 104 mg/dL — ABNORMAL HIGH (ref 70–99)
Potassium: 3.8 mmol/L (ref 3.5–5.1)
Sodium: 138 mmol/L (ref 135–145)
Total Bilirubin: 0.5 mg/dL (ref 0.3–1.2)
Total Protein: 7.4 g/dL (ref 6.5–8.1)

## 2020-11-07 LAB — CBC WITH DIFFERENTIAL/PLATELET
Abs Immature Granulocytes: 0.02 10*3/uL (ref 0.00–0.07)
Basophils Absolute: 0.1 10*3/uL (ref 0.0–0.1)
Basophils Relative: 1 %
Eosinophils Absolute: 0.3 10*3/uL (ref 0.0–0.5)
Eosinophils Relative: 3 %
HCT: 40.4 % (ref 36.0–46.0)
Hemoglobin: 12.8 g/dL (ref 12.0–15.0)
Immature Granulocytes: 0 %
Lymphocytes Relative: 30 %
Lymphs Abs: 2.6 10*3/uL (ref 0.7–4.0)
MCH: 28.4 pg (ref 26.0–34.0)
MCHC: 31.7 g/dL (ref 30.0–36.0)
MCV: 89.8 fL (ref 80.0–100.0)
Monocytes Absolute: 0.7 10*3/uL (ref 0.1–1.0)
Monocytes Relative: 8 %
Neutro Abs: 5 10*3/uL (ref 1.7–7.7)
Neutrophils Relative %: 58 %
Platelets: 229 10*3/uL (ref 150–400)
RBC: 4.5 MIL/uL (ref 3.87–5.11)
RDW: 12.8 % (ref 11.5–15.5)
WBC: 8.7 10*3/uL (ref 4.0–10.5)
nRBC: 0 % (ref 0.0–0.2)

## 2020-11-07 MED ORDER — FLUTICASONE PROPIONATE 50 MCG/ACT NA SUSP
1.0000 | Freq: Every day | NASAL | 2 refills | Status: DC
  Filled 2020-11-07: qty 16, 60d supply, fill #0

## 2020-11-07 NOTE — ED Provider Notes (Signed)
[employer] [employer]    CSN: [id number] Arrival date & time: 11/07/20  3976      History   Chief Complaint Chief Complaint  Patient presents with  . Cough  . Emesis  . Diarrhea    HPI Carol Porter is a 30 y.o. female.   Patient presents today with a 6-day history of cough.  She initially thought this was related to allergies as symptoms began after she unrolled down to a rug that was covered in mold.  She does have a history of allergies and asthma and has been using Xopenex without improvement of symptoms.  She reports significant coughing to the point she is experiencing posttussive emesis.  Reports emesis is described as food contents.  She reports associated diarrhea which she describes as 2 loose/watery bowel movements per day without blood or mucus.  She denies any fever, chest pain, shortness of breath.  She did not home Covid test that was negative.  She is using Flonase without improvement of symptoms.  She has a history of idiopathic intracranial hypertension and so is hesitant to take many medications.  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  She is up-to-date on flu shot.  She has had initial vaccine series for COVID-19 but has not had booster as this can trigger his headaches.  She reports ongoing headaches and dizziness but states this is chronic and related to IIH; unchanged from baseline.     Past Medical History:  Diagnosis Date  . Allergy   . Anxiety   . Asthma   . Depression   . Idiopathic intracranial hypertension     There are no problems to display for this patient.   History reviewed. No pertinent surgical history.  OB History   No obstetric history on file.      Home Medications    Prior to Admission medications   Medication Sig Start Date End Date Taking? Authorizing Provider  acetaZOLAMIDE (DIAMOX) 250 MG tablet Take by mouth. 03/29/20  Yes [provider]  Fluoxetine HCl, PMDD, 10 MG TABS Take by  mouth. 10/26/18 12/29/20 Yes [provider]  fluticasone (FLONASE) 50 MCG/ACT nasal spray Place 1 spray into both nostrils daily. 11/07/20  Yes Damain Broadus K, PA-C  ranitidine (ZANTAC) 75 MG tablet Take 75 mg by mouth as needed.     [provider]  sucralfate (CARAFATE) 1 g tablet Take 1 tablet by mouth 4 (four) times daily. 09/30/17 11/07/20  [provider]  SYEDA 3-0.03 MG tablet Take 1 tablet by mouth daily. 09/30/17 11/07/20  [provider]    Family History History reviewed. No pertinent family history.  Social History Social History   Tobacco Use  . Smoking status: Never Smoker  . Smokeless tobacco: Never Used  Substance Use Topics  . Alcohol use: Yes    Comment: social  . Drug use: No     Allergies   Omeprazole, Flagyl [metronidazole], Oxycodone-acetaminophen, and Phentermine   Review of Systems Review of Systems  Constitutional: Positive for activity change and appetite change. Negative for fatigue and fever.  HENT: Positive for congestion. Negative for sinus pressure, sneezing and sore throat.   Respiratory: Positive for cough and shortness of breath.   Cardiovascular: Negative for chest pain.  Gastrointestinal: Positive for diarrhea, nausea and vomiting. Negative for abdominal pain.  Neurological: Positive for dizziness (chronic) and headaches (chronic).     Physical Exam Triage Vital Signs ED Triage Vitals  Enc Vitals  Group     BP 11/07/20 0941 112/80     Pulse Rate 11/07/20 0941 76     Resp --      Temp 11/07/20 0941 98.1 F (36.7 C)     Temp src --      SpO2 11/07/20 0941 98 %     Weight --      Height --      Head Circumference --      Peak Flow --      Pain Score 11/07/20 0937 0     Pain Loc --      Pain Edu? --      Excl. in [place]? --    No data found.  Updated Vital Signs BP 112/80   Pulse 76   Temp 98.1 F (36.7 C)   LMP 11/02/2020 (Exact Date)   SpO2 98%   Visual Acuity Right Eye Distance:   Left Eye  Distance:   Bilateral Distance:    Right Eye Near:   Left Eye Near:    Bilateral Near:     Physical Exam Vitals reviewed.  Constitutional:      General: She is awake. She is not in acute distress.    Appearance: Normal appearance. She is not ill-appearing.     Comments: Very pleasant female appears stated age in no acute distress  HENT:     Head: Normocephalic and atraumatic.     Right Ear: Tympanic membrane, ear canal and external ear normal. Tympanic membrane is not erythematous or bulging.     Left Ear: Tympanic membrane, ear canal and external ear normal. Tympanic membrane is not erythematous or bulging.     Mouth/Throat:     Pharynx: Uvula midline. Posterior oropharyngeal erythema present. No oropharyngeal exudate.     Comments: Mild erythema posterior oropharynx Cardiovascular:     Rate and Rhythm: Normal rate and regular rhythm.     Heart sounds: No murmur heard.   Pulmonary:     Effort: Pulmonary effort is normal.     Breath sounds: Normal breath sounds. No wheezing, rhonchi or rales.     Comments: Clear to auscultation bilaterally Abdominal:     General: Bowel sounds are normal.     Palpations: Abdomen is soft.     Tenderness: There is no abdominal tenderness.     Comments: Benign abdominal exam; no tenderness to palpation.  Musculoskeletal:     Right lower leg: No edema.     Left lower leg: No edema.  Psychiatric:        Behavior: Behavior is cooperative.      UC Treatments / Results  Labs (all labs ordered are listed, but only abnormal results are displayed) Labs Reviewed  CBC WITH DIFFERENTIAL/PLATELET  COMPREHENSIVE METABOLIC PANEL    EKG   Radiology DG Chest 2 View  Result Date: 11/07/2020 CLINICAL DATA:  Cough.  Vomiting. EXAM: CHEST - 2 VIEW COMPARISON:  10/01/2017 FINDINGS: Both lungs are clear. Heart and mediastinum are within normal limits. Trachea is midline. Negative for a pneumothorax. No pleural effusions. Bone structures are  unremarkable. IMPRESSION: No active cardiopulmonary disease. Electronically Signed   By: Markus Daft M.D.   On: 11/07/2020 10:13    Procedures Procedures (including critical care time)  Medications Ordered in UC Medications - No data to display  Initial Impression / Assessment and Plan / UC Course  I have reviewed the triage vital signs and the nursing notes.  Pertinent labs & imaging results that were available  during my care of the patient were reviewed by me and considered in my medical decision making (see chart for details).     Chest x-ray was negative in office.  CBC and CMP obtained today-results pending.  Given patient has been symptomatic for approximately 1 week we will not plan to repeat COVID-19 or flu testing at this time.  She was provided a refill of Flonase as requested.  She is to continue over-the-counter medications for symptom relief.  Offered prescription for Zofran but patient declined this as she is very sensitive to medications with her history of IIH.  Strict return precautions given to which patient expressed understanding.  Final Clinical Impressions(s) / UC Diagnoses   Final diagnoses:  Cough  Nausea vomiting and diarrhea  Nasal congestion     Discharge Instructions     Use Flonase. Your x-ray was normal. We will be in touch with lab results.     ED Prescriptions    Medication Sig Dispense Auth. Provider   fluticasone (FLONASE) 50 MCG/ACT nasal spray Place 1 spray into both nostrils daily. 16 g Geniyah Eischeid K, PA-C     PDMP not reviewed this encounter.   Terrilee Croak, PA-C 11/07/20 1029

## 2020-11-07 NOTE — ED Triage Notes (Signed)
Pt in with c/o vomiting, diarrhea, and productive cough that has been going on for 6 days  Pt has been using her inhaler and flonase for sxs relief

## 2020-11-07 NOTE — Discharge Instructions (Addendum)
Use Flonase. Your x-ray was normal. We will be in touch with lab results.

## 2020-11-29 DIAGNOSIS — Z113 Encounter for screening for infections with a predominantly sexual mode of transmission: Secondary | ICD-10-CM | POA: Diagnosis not present

## 2020-11-29 DIAGNOSIS — Z3009 Encounter for other general counseling and advice on contraception: Secondary | ICD-10-CM | POA: Diagnosis not present

## 2020-11-29 DIAGNOSIS — Z01419 Encounter for gynecological examination (general) (routine) without abnormal findings: Secondary | ICD-10-CM | POA: Diagnosis not present

## 2020-12-21 DIAGNOSIS — I676 Nonpyogenic thrombosis of intracranial venous system: Secondary | ICD-10-CM | POA: Diagnosis not present

## 2020-12-21 DIAGNOSIS — Z9989 Dependence on other enabling machines and devices: Secondary | ICD-10-CM | POA: Diagnosis not present

## 2020-12-21 DIAGNOSIS — G4733 Obstructive sleep apnea (adult) (pediatric): Secondary | ICD-10-CM | POA: Diagnosis not present

## 2020-12-21 DIAGNOSIS — H93A1 Pulsatile tinnitus, right ear: Secondary | ICD-10-CM | POA: Diagnosis not present

## 2021-01-14 ENCOUNTER — Other Ambulatory Visit (HOSPITAL_COMMUNITY): Payer: Self-pay

## 2021-01-16 ENCOUNTER — Other Ambulatory Visit (HOSPITAL_COMMUNITY): Payer: Self-pay

## 2021-01-16 MED ORDER — BOTOX 200 UNITS IJ SOLR
INTRAMUSCULAR | 3 refills | Status: DC
  Filled 2021-01-16: qty 1, 90d supply, fill #0
  Filled 2021-05-06: qty 1, 90d supply, fill #1
  Filled 2021-07-25 – 2021-09-03 (×2): qty 1, 90d supply, fill #2

## 2021-01-22 ENCOUNTER — Other Ambulatory Visit (HOSPITAL_COMMUNITY): Payer: Self-pay

## 2021-01-25 DIAGNOSIS — G4733 Obstructive sleep apnea (adult) (pediatric): Secondary | ICD-10-CM | POA: Diagnosis not present

## 2021-02-11 DIAGNOSIS — Z79899 Other long term (current) drug therapy: Secondary | ICD-10-CM | POA: Diagnosis not present

## 2021-02-11 DIAGNOSIS — G932 Benign intracranial hypertension: Secondary | ICD-10-CM | POA: Diagnosis not present

## 2021-02-11 DIAGNOSIS — H93A1 Pulsatile tinnitus, right ear: Secondary | ICD-10-CM | POA: Diagnosis not present

## 2021-02-11 DIAGNOSIS — I676 Nonpyogenic thrombosis of intracranial venous system: Secondary | ICD-10-CM | POA: Diagnosis not present

## 2021-02-18 DIAGNOSIS — G43111 Migraine with aura, intractable, with status migrainosus: Secondary | ICD-10-CM | POA: Diagnosis not present

## 2021-02-24 DIAGNOSIS — G43719 Chronic migraine without aura, intractable, without status migrainosus: Secondary | ICD-10-CM | POA: Insufficient documentation

## 2021-03-04 ENCOUNTER — Other Ambulatory Visit: Payer: Self-pay

## 2021-03-04 MED ORDER — ASPIRIN 325 MG PO TABS: 325.0000 mg | ORAL_TABLET | Freq: Every day | ORAL | 2 refills | Status: DC

## 2021-03-04 MED ORDER — CLOPIDOGREL BISULFATE 75 MG PO TABS
ORAL_TABLET | ORAL | 2 refills | Status: DC
  Filled 2021-04-11: qty 30, 30d supply, fill #0
  Filled 2021-05-06: qty 30, 30d supply, fill #1
  Filled 2021-06-03: qty 30, 30d supply, fill #2

## 2021-03-09 ENCOUNTER — Ambulatory Visit: Payer: Self-pay

## 2021-03-18 ENCOUNTER — Other Ambulatory Visit (HOSPITAL_COMMUNITY): Payer: Self-pay

## 2021-03-18 MED ORDER — ACETAZOLAMIDE 250 MG PO TABS
ORAL_TABLET | ORAL | 0 refills | Status: DC
  Filled 2021-03-18: qty 360, 90d supply, fill #0

## 2021-03-19 ENCOUNTER — Other Ambulatory Visit (HOSPITAL_COMMUNITY): Payer: Self-pay

## 2021-03-21 ENCOUNTER — Other Ambulatory Visit (HOSPITAL_COMMUNITY): Payer: Self-pay

## 2021-03-21 MED ORDER — FLUOXETINE HCL 10 MG PO TABS
ORAL_TABLET | Freq: Every day | ORAL | 2 refills | Status: DC
  Filled 2021-03-21: qty 90, 60d supply, fill #0

## 2021-03-22 ENCOUNTER — Other Ambulatory Visit (HOSPITAL_COMMUNITY): Payer: Self-pay

## 2021-03-22 ENCOUNTER — Other Ambulatory Visit (HOSPITAL_BASED_OUTPATIENT_CLINIC_OR_DEPARTMENT_OTHER): Payer: Self-pay

## 2021-03-22 MED ORDER — FLUOXETINE HCL 10 MG PO TABS
ORAL_TABLET | ORAL | 3 refills | Status: DC
  Filled 2021-05-06: qty 135, 90d supply, fill #0
  Filled 2021-07-25: qty 135, 90d supply, fill #1
  Filled 2021-10-17 – 2021-10-21 (×2): qty 135, 90d supply, fill #0
  Filled 2022-01-20: qty 135, 90d supply, fill #1

## 2021-03-25 ENCOUNTER — Ambulatory Visit (INDEPENDENT_AMBULATORY_CARE_PROVIDER_SITE_OTHER): Payer: 59 | Admitting: Podiatry

## 2021-03-25 ENCOUNTER — Encounter: Payer: Self-pay | Admitting: Podiatry

## 2021-03-25 ENCOUNTER — Other Ambulatory Visit: Payer: Self-pay

## 2021-03-25 DIAGNOSIS — M722 Plantar fascial fibromatosis: Secondary | ICD-10-CM | POA: Diagnosis not present

## 2021-03-25 DIAGNOSIS — N939 Abnormal uterine and vaginal bleeding, unspecified: Secondary | ICD-10-CM | POA: Insufficient documentation

## 2021-03-25 NOTE — Progress Notes (Signed)
  Subjective:  Patient ID: Carol Porter, female    DOB: 13-Jun-1991,  MRN: [id number] HPI Chief Complaint  Patient presents with   Foot Orthotics    Requesting new orthotics - would like 2 pairs: sneakers and work boots   New Patient (Initial Visit)    Est pt 99    30 y.o. female presents with the above complaint.   ROS: Denies fever chills nausea vomiting muscle aches pains calf pain back pain chest pain shortness of breath.  Past Medical History:  Diagnosis Date   Allergy    Anxiety    Asthma    Depression    Idiopathic intracranial hypertension    No past surgical history on file.  Current Outpatient Medications:    acetaZOLAMIDE (DIAMOX) 250 MG tablet, Take by mouth., Disp: , Rfl:    hydrocortisone (ANUSOL-HC) 25 MG suppository, Place rectally., Disp: , Rfl:    aspirin 81 MG EC tablet, Take by mouth., Disp: , Rfl:    Botulinum Toxin Type A (BOTOX) 200 units SOLR, INJECT 200 UNITS INTO THE MUSCLES OF MULTIPLE SITES OF THE FACE AND NECK BY PHYSICIAN ONCE EVERY 3 MONTHS., Disp: 1 each, Rfl: 3   clopidogrel (PLAVIX) 75 MG tablet, Take 1 tablet (75 mg total) by mouth daily. Start date: 9/15, Disp: 30 tablet, Rfl: 2   FLUoxetine (PROZAC) 10 MG tablet, Take 1 and 1/2 tablets by mouth once daily, Disp: 135 tablet, Rfl: 3   fluticasone (FLONASE) 50 MCG/ACT nasal spray, Place 1 spray into both nostrils daily., Disp: 16 g, Rfl: 2   Magnesium Bisglycinate (MAG GLYCINATE) 100 MG TABS, Take by mouth., Disp: , Rfl:    ranitidine (ZANTAC) 75 MG tablet, Take 75 mg by mouth as needed. , Disp: , Rfl:   Allergies  Allergen Reactions   Omeprazole Hives, Itching and Rash   Flagyl [Metronidazole] Other (See Comments)    Tachycardia , dizziness , nausea and vomiting   Oxycodone-Acetaminophen Nausea And Vomiting   Phentermine     Fast heart rate and light headed   Sulfamethoxazole-Trimethoprim Nausea And Vomiting and Nausea Only   Review of Systems Objective:  There were no vitals filed  for this visit.  General: Well developed, nourished, in no acute distress, alert and oriented x3   Dermatological: Skin is warm, dry and supple bilateral. Nails x 10 are well maintained; remaining integument appears unremarkable at this time. There are no open sores, no preulcerative lesions, no rash or signs of infection present.  Vascular: Dorsalis Pedis artery and Posterior Tibial artery pedal pulses are 2/4 bilateral with immedate capillary fill time. Pedal hair growth present. No varicosities and no lower extremity edema present bilateral.   Neruologic: Grossly intact via light touch bilateral. Vibratory intact via tuning fork bilateral. Protective threshold with Semmes Wienstein monofilament intact to all pedal sites bilateral. Patellar and Achilles deep tendon reflexes 2+ bilateral. No Babinski or clonus noted bilateral.   Musculoskeletal: No gross boney pedal deformities bilateral. No pain, crepitus, or limitation noted with foot and ankle range of motion bilateral. Muscular strength 5/5 in all groups tested bilateral.  History of Planter fasciitis.  Gait: Unassisted, Nonantalgic.    Radiographs:  None taken  Assessment & Plan:   Assessment: History of Planter fasciitis  Plan: New set of orthotics x2     Abimael Zeiter T. [place], [place]

## 2021-03-30 ENCOUNTER — Ambulatory Visit (HOSPITAL_COMMUNITY)
Admission: EM | Admit: 2021-03-30 | Discharge: 2021-03-30 | Disposition: A | Discharge status: Home / Self Care | Payer: 59 | Attending: Emergency Medicine | Admitting: Emergency Medicine

## 2021-03-30 ENCOUNTER — Encounter (HOSPITAL_COMMUNITY): Payer: Self-pay | Admitting: *Deleted

## 2021-03-30 ENCOUNTER — Other Ambulatory Visit: Payer: Self-pay

## 2021-03-30 DIAGNOSIS — R531 Weakness: Secondary | ICD-10-CM | POA: Diagnosis not present

## 2021-03-30 DIAGNOSIS — Z20822 Contact with and (suspected) exposure to covid-19: Secondary | ICD-10-CM | POA: Diagnosis not present

## 2021-03-30 DIAGNOSIS — Z8616 Personal history of COVID-19: Secondary | ICD-10-CM | POA: Insufficient documentation

## 2021-03-30 LAB — POC URINE PREG, ED: Preg Test, Ur: NEGATIVE

## 2021-03-30 LAB — BASIC METABOLIC PANEL
Anion gap: 6 (ref 5–15)
BUN: 11 mg/dL (ref 6–20)
CO2: 23 mmol/L (ref 22–32)
Calcium: 8.9 mg/dL (ref 8.9–10.3)
Chloride: 109 mmol/L (ref 98–111)
Creatinine, Ser: 0.81 mg/dL (ref 0.44–1.00)
GFR, Estimated: >60 mL/min (ref >60)
Glucose, Bld: 133 mg/dL — ABNORMAL HIGH (ref 70–99)
Potassium: 3.9 mmol/L (ref 3.5–5.1)
Sodium: 138 mmol/L (ref 135–145)

## 2021-03-30 LAB — POCT URINALYSIS DIPSTICK, ED / UC
Bilirubin Urine: NEGATIVE
Glucose, UA: NEGATIVE mg/dL
Hgb urine dipstick: NEGATIVE
Ketones, ur: NEGATIVE mg/dL
Nitrite: NEGATIVE
Protein, ur: NEGATIVE mg/dL
Specific Gravity, Urine: 1.02 (ref 1.005–1.030)
Urobilinogen, UA: 0.2 mg/dL (ref 0.0–1.0)
pH: 7 (ref 5.0–8.0)

## 2021-03-30 LAB — CBC
HCT: 39.4 % (ref 36.0–46.0)
Hemoglobin: 12.6 g/dL (ref 12.0–15.0)
MCH: 28.9 pg (ref 26.0–34.0)
MCHC: 32 g/dL (ref 30.0–36.0)
MCV: 90.4 fL (ref 80.0–100.0)
Platelets: 249 10*3/uL (ref 150–400)
RBC: 4.36 MIL/uL (ref 3.87–5.11)
RDW: 12.9 % (ref 11.5–15.5)
WBC: 10.1 10*3/uL (ref 4.0–10.5)
nRBC: 0 % (ref 0.0–0.2)

## 2021-03-30 LAB — BRAIN NATRIURETIC PEPTIDE: B Natriuretic Peptide: 36.5 pg/mL (ref 0.0–100.0)

## 2021-03-30 NOTE — Discharge Instructions (Addendum)
We will follow up with if your lab results require any additional testing or treatment.    Make sure to drink plenty of fluids, especially water.    Take all of your medications as prescribed.    Follow up with your primary care provider for further evaluation.  If you develop the worse headache of your life, worsening dizziness, nausea/vomiting, blurred vision, slurred speech, difficulty walking, weakness on one side, chest pain, shortness of breath, or altered mental status, call 911 or go directly to the Emergency Department for further evaluation.

## 2021-03-30 NOTE — ED Provider Notes (Signed)
[facility]    CSN: [id number] Arrival date & time: 03/30/21  1011      History   Chief Complaint Chief Complaint  Patient presents with   Weakness   Fatigue    HPI Carol Porter is a 30 y.o. female.   Patient is here for evaluation of weakness and fatigue that has been ongoing for the past 5 days.  Patient is taking a diuretic and has a history of hypokalemia, anemia, and PCOS.  Also history of IIH.  No acute visual changes.  LMP unknown.  Reports taking a multivitamin and potassium with minimal symptom relief.  Denies any trauma, injury, or other precipitating event.  Denies any specific alleviating or aggravating factors.  Denies any fevers, chest pain, shortness of breath, vomiting, or abdominal pain.    The history is provided by the patient.   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) 04/12/2020   OSA on CPAP 11/17/2018   Positive ANA (antinuclear antibody) 08/11/2018   PCOS (polycystic ovarian syndrome) 06/14/2018   Inappropriate sinus tachycardia 11/04/2017   Mild intermittent asthma without complication [id number]   ADHD, predominantly inattentive type 10/22/2015   Other specified eating disorder 08/31/2015   Anxiety disorder, unspecified 08/03/2015   Moderate episode of recurrent major depressive disorder ([place]) 08/03/2015   Unspecified hemorrhoids 08/03/2015   Classical migraine with intractable migraine 06/30/2014   Epigastric pain 05/12/2014   History of irritable bowel syndrome 05/12/2014    Past Surgical History:  Procedure Laterality Date   LUMBAR PUNCTURE      OB History   No  obstetric history on file.      Home Medications    Prior to Admission medications   Medication Sig Start Date End Date Taking? Authorizing Provider  acetaZOLAMIDE (DIAMOX) 250 MG tablet Take by mouth. 09/14/20 06/16/21 Yes [provider]  Botulinum Toxin Type A (BOTOX) 200 units SOLR INJECT 200 UNITS INTO THE MUSCLES OF MULTIPLE SITES OF THE FACE AND NECK BY PHYSICIAN ONCE EVERY 3 MONTHS. 01/16/21  Yes   FLUoxetine (PROZAC) 10 MG tablet Take 1 and 1/2 tablets by mouth once daily 03/22/21  Yes   fluticasone (FLONASE) 50 MCG/ACT nasal spray Place 1 spray into both nostrils daily. 11/07/20  Yes Raspet, Erin K, PA-C  ranitidine (ZANTAC) 75 MG tablet Take 75 mg by mouth as needed.    Yes [provider]  aspirin 81 MG EC tablet Take by mouth.    [provider]  clopidogrel (PLAVIX) 75 MG tablet Take 1 tablet (75 mg total) by mouth daily. Start date: 9/15 03/04/21     hydrocortisone (ANUSOL-HC) 25 MG suppository Place rectally. 05/02/20   [provider]  Magnesium Bisglycinate (MAG GLYCINATE) 100 MG TABS Take by mouth.    [provider]  sucralfate (CARAFATE) 1 g tablet Take 1 tablet by mouth 4 (four) times daily. 09/30/17 11/07/20  [provider]  SYEDA 3-0.03 MG tablet Take 1 tablet by mouth daily. 09/30/17 11/07/20  [provider]    Family History History reviewed. No pertinent family history.  Social History Social History   Tobacco  Use   Smoking status: Never   Smokeless tobacco: Never  Vaping Use   Vaping Use: Never used  Substance Use Topics   Alcohol use: Yes    Comment: social   Drug use: No     Allergies   Omeprazole, Flagyl [metronidazole], Oxycodone-acetaminophen, Phentermine, and Sulfamethoxazole-trimethoprim   Review of Systems Review of Systems  Constitutional:  Positive for fatigue.  HENT:  Positive for congestion. Negative for sore throat.   Respiratory:  Negative for cough.   Gastrointestinal:   Positive for diarrhea and nausea. Negative for abdominal pain, constipation and vomiting.  Neurological:  Positive for weakness and headaches.  All other systems reviewed and are negative.   Physical Exam Triage Vital Signs ED Triage Vitals  Enc Vitals Group     BP      Pulse      Resp      Temp      Temp src      SpO2      Weight      Height      Head Circumference      Peak Flow      Pain Score      Pain Loc      Pain Edu?      Excl. in [place]?    No data found.  Updated Vital Signs BP 111/77   Pulse 78   Temp (!) 97.5 F (36.4 C) (Oral)   Resp 18   Wt 282 lb 6.4 oz (128.1 kg)   LMP  (LMP Unknown)   SpO2 98%   BMI 41.70 kg/m   Visual Acuity Right Eye Distance:   Left Eye Distance:   Bilateral Distance:    Right Eye Near:   Left Eye Near:    Bilateral Near:     Physical Exam Vitals and nursing note reviewed.  Constitutional:      General: She is not in acute distress.    Appearance: Normal appearance. She is not ill-appearing, toxic-appearing or diaphoretic.  HENT:     Head: Normocephalic and atraumatic.  Eyes:     Conjunctiva/sclera: Conjunctivae normal.  Cardiovascular:     Rate and Rhythm: Normal rate.     Pulses: Normal pulses.  Pulmonary:     Effort: Pulmonary effort is normal.  Abdominal:     General: Abdomen is flat.  Musculoskeletal:        General: Normal range of motion.     Cervical back: Normal range of motion.  Skin:    General: Skin is warm and dry.  Neurological:     General: No focal deficit present.     Mental Status: She is alert and oriented to person, place, and time.     GCS: GCS eye subscore is 4. GCS verbal subscore is 5. GCS motor subscore is 6.     Motor: Motor function is intact.     Coordination: Coordination is intact.     Gait: Gait is intact.  Psychiatric:        Mood and Affect: Mood normal.     UC Treatments / Results  Labs (all labs ordered are listed, but only abnormal results are displayed) Labs  Reviewed  POCT URINALYSIS DIPSTICK, ED / UC - Abnormal; Notable for the following components:      Result Value   Leukocytes,Ua TRACE (*)    All other components within normal limits  SARS CORONAVIRUS 2 (TAT 6-24 HRS)  CBC  BASIC METABOLIC PANEL  BRAIN NATRIURETIC PEPTIDE  POC URINE PREG, ED    EKG   Radiology No results found.  Procedures Procedures (including critical care time)  Medications Ordered in UC Medications - No data to display  Initial Impression / Assessment and Plan / UC Course  I have reviewed the triage vital signs and the nursing notes.  Pertinent labs & imaging results that were available during my care of the patient were reviewed by me and considered in my medical decision making (see chart for details).    Assessment negative for red flags or concerns.  No focal neurodeficits.  Urinalysis with trace leukocytes but otherwise negative with no signs of infection.  Urine pregnancy test negative.  Will obtain BNP, BMP, and CBC.  COVID test pending.  Encourage fluids especially water.  Follow-up with primary care for reevaluation.  Strict ED follow-up for any red flag symptoms.  Final Clinical Impressions(s) / UC Diagnoses   Final diagnoses:  Weakness     Discharge Instructions      We will follow up with if your lab results require any additional testing or treatment.    Make sure to drink plenty of fluids, especially water.    Take all of your medications as prescribed.    Follow up with your primary care provider for further evaluation.  If you develop the worse headache of your life, worsening dizziness, nausea/vomiting, blurred vision, slurred speech, difficulty walking, weakness on one side, chest pain, shortness of breath, or altered mental status, call 911 or go directly to the Emergency Department for further evaluation.       ED Prescriptions   None    PDMP not reviewed this encounter.   Pearson Forster, NP 03/30/21 1112

## 2021-03-30 NOTE — ED Triage Notes (Signed)
Pt c/o weakness and fatigue onset 5 days ago.  States takes a diuretic and occasionally gets these sxs when K+ level is off.

## 2021-03-31 LAB — SARS CORONAVIRUS 2 (TAT 6-24 HRS): SARS Coronavirus 2: NEGATIVE

## 2021-04-04 ENCOUNTER — Other Ambulatory Visit (HOSPITAL_BASED_OUTPATIENT_CLINIC_OR_DEPARTMENT_OTHER): Payer: Self-pay

## 2021-04-04 DIAGNOSIS — Z3009 Encounter for other general counseling and advice on contraception: Secondary | ICD-10-CM | POA: Diagnosis not present

## 2021-04-04 DIAGNOSIS — G932 Benign intracranial hypertension: Secondary | ICD-10-CM | POA: Diagnosis not present

## 2021-04-04 DIAGNOSIS — N920 Excessive and frequent menstruation with regular cycle: Secondary | ICD-10-CM | POA: Diagnosis not present

## 2021-04-04 MED ORDER — SLYND 4 MG PO TABS
ORAL_TABLET | ORAL | 4 refills | Status: DC
  Filled 2021-04-04: qty 112, 84d supply, fill #0
  Filled 2021-04-11 (×2): qty 84, 72d supply, fill #0
  Filled 2021-05-13: qty 112, 84d supply, fill #0

## 2021-04-05 ENCOUNTER — Other Ambulatory Visit (HOSPITAL_BASED_OUTPATIENT_CLINIC_OR_DEPARTMENT_OTHER): Payer: Self-pay

## 2021-04-08 ENCOUNTER — Other Ambulatory Visit (HOSPITAL_BASED_OUTPATIENT_CLINIC_OR_DEPARTMENT_OTHER): Payer: Self-pay

## 2021-04-11 ENCOUNTER — Other Ambulatory Visit: Payer: Self-pay

## 2021-04-11 ENCOUNTER — Other Ambulatory Visit (HOSPITAL_BASED_OUTPATIENT_CLINIC_OR_DEPARTMENT_OTHER): Payer: Self-pay

## 2021-04-11 DIAGNOSIS — G4733 Obstructive sleep apnea (adult) (pediatric): Secondary | ICD-10-CM | POA: Diagnosis not present

## 2021-04-11 DIAGNOSIS — G43719 Chronic migraine without aura, intractable, without status migrainosus: Secondary | ICD-10-CM | POA: Diagnosis not present

## 2021-04-11 DIAGNOSIS — H93A9 Pulsatile tinnitus, unspecified ear: Secondary | ICD-10-CM | POA: Diagnosis not present

## 2021-04-11 DIAGNOSIS — F411 Generalized anxiety disorder: Secondary | ICD-10-CM | POA: Diagnosis not present

## 2021-04-11 MED ORDER — FLUOXETINE HCL 10 MG PO TABS
ORAL_TABLET | ORAL | 4 refills | Status: DC
  Filled 2021-04-11: qty 90, 60d supply, fill #0

## 2021-04-15 ENCOUNTER — Other Ambulatory Visit: Payer: Self-pay

## 2021-04-15 DIAGNOSIS — J3489 Other specified disorders of nose and nasal sinuses: Secondary | ICD-10-CM | POA: Diagnosis not present

## 2021-04-15 DIAGNOSIS — Z91013 Allergy to seafood: Secondary | ICD-10-CM | POA: Diagnosis not present

## 2021-04-15 DIAGNOSIS — J301 Allergic rhinitis due to pollen: Secondary | ICD-10-CM | POA: Diagnosis not present

## 2021-04-15 MED ORDER — EPINEPHRINE 0.3 MG/0.3ML IJ SOAJ
INTRAMUSCULAR | 3 refills | Status: DC
  Filled 2021-04-15: qty 2, 4d supply, fill #0

## 2021-04-15 MED ORDER — TRIAMCINOLONE ACETONIDE 55 MCG/ACT NA AERO: INHALATION_SPRAY | NASAL | 12 refills | Status: AC

## 2021-04-18 DIAGNOSIS — J45909 Unspecified asthma, uncomplicated: Secondary | ICD-10-CM | POA: Diagnosis not present

## 2021-04-18 DIAGNOSIS — I676 Nonpyogenic thrombosis of intracranial venous system: Secondary | ICD-10-CM | POA: Diagnosis not present

## 2021-04-18 DIAGNOSIS — G932 Benign intracranial hypertension: Secondary | ICD-10-CM | POA: Diagnosis not present

## 2021-04-18 DIAGNOSIS — Z79899 Other long term (current) drug therapy: Secondary | ICD-10-CM | POA: Diagnosis not present

## 2021-04-18 DIAGNOSIS — Z6841 Body Mass Index (BMI) 40.0 and over, adult: Secondary | ICD-10-CM | POA: Diagnosis not present

## 2021-04-18 DIAGNOSIS — G43909 Migraine, unspecified, not intractable, without status migrainosus: Secondary | ICD-10-CM | POA: Diagnosis not present

## 2021-04-19 ENCOUNTER — Other Ambulatory Visit: Payer: Self-pay

## 2021-04-19 DIAGNOSIS — I676 Nonpyogenic thrombosis of intracranial venous system: Secondary | ICD-10-CM | POA: Diagnosis not present

## 2021-04-19 MED ORDER — ACETAMINOPHEN-CODEINE #3 300-30 MG PO TABS
ORAL_TABLET | ORAL | 0 refills | Status: DC
  Filled 2021-04-19: qty 20, 4d supply, fill #0

## 2021-04-19 MED ORDER — SENNOSIDES-DOCUSATE SODIUM 8.6-50 MG PO TABS: 2.0000 | ORAL_TABLET | Freq: Every day | ORAL | 0 refills | Status: DC

## 2021-04-24 ENCOUNTER — Telehealth: Payer: Self-pay | Admitting: Podiatry

## 2021-04-24 NOTE — Telephone Encounter (Signed)
2 pr orthotics in.. lvm for pt ok to pick up as of 9.29 in [place] office. If she feels she needs an appt she can call to schedule.

## 2021-04-25 ENCOUNTER — Ambulatory Visit
Admission: RE | Admit: 2021-04-25 | Discharge: 2021-04-25 | Disposition: A | Discharge status: Home / Self Care | Payer: 59 | Source: Ambulatory Visit | Attending: Emergency Medicine | Admitting: Emergency Medicine

## 2021-04-25 ENCOUNTER — Other Ambulatory Visit: Payer: Self-pay

## 2021-04-25 ENCOUNTER — Ambulatory Visit (INDEPENDENT_AMBULATORY_CARE_PROVIDER_SITE_OTHER)
Admission: RE | Admit: 2021-04-25 | Discharge: 2021-04-25 | Disposition: A | Discharge status: Home / Self Care | Payer: 59 | Source: Ambulatory Visit | Attending: Emergency Medicine | Admitting: Emergency Medicine

## 2021-04-25 VITALS — BP 136/75 | HR 111 | Temp 98.3°F | Resp 18 | Ht 69.0 in | Wt 280.0 lb

## 2021-04-25 DIAGNOSIS — Z20822 Contact with and (suspected) exposure to covid-19: Secondary | ICD-10-CM | POA: Diagnosis not present

## 2021-04-25 DIAGNOSIS — Z95828 Presence of other vascular implants and grafts: Secondary | ICD-10-CM | POA: Diagnosis not present

## 2021-04-25 DIAGNOSIS — Z79899 Other long term (current) drug therapy: Secondary | ICD-10-CM | POA: Diagnosis not present

## 2021-04-25 DIAGNOSIS — Z7902 Long term (current) use of antithrombotics/antiplatelets: Secondary | ICD-10-CM | POA: Insufficient documentation

## 2021-04-25 DIAGNOSIS — R55 Syncope and collapse: Secondary | ICD-10-CM

## 2021-04-25 DIAGNOSIS — Z881 Allergy status to other antibiotic agents status: Secondary | ICD-10-CM | POA: Insufficient documentation

## 2021-04-25 DIAGNOSIS — Z982 Presence of cerebrospinal fluid drainage device: Secondary | ICD-10-CM | POA: Diagnosis not present

## 2021-04-25 DIAGNOSIS — Z7982 Long term (current) use of aspirin: Secondary | ICD-10-CM | POA: Insufficient documentation

## 2021-04-25 DIAGNOSIS — I10 Essential (primary) hypertension: Secondary | ICD-10-CM | POA: Diagnosis not present

## 2021-04-25 LAB — CBC WITH DIFFERENTIAL/PLATELET
Abs Immature Granulocytes: 0.06 10*3/uL (ref 0.00–0.07)
Basophils Absolute: 0.1 10*3/uL (ref 0.0–0.1)
Basophils Relative: 0 %
Eosinophils Absolute: 0.1 10*3/uL (ref 0.0–0.5)
Eosinophils Relative: 1 %
HCT: 41.4 % (ref 36.0–46.0)
Hemoglobin: 13.3 g/dL (ref 12.0–15.0)
Immature Granulocytes: 1 %
Lymphocytes Relative: 16 %
Lymphs Abs: 1.9 10*3/uL (ref 0.7–4.0)
MCH: 28.2 pg (ref 26.0–34.0)
MCHC: 32.1 g/dL (ref 30.0–36.0)
MCV: 87.7 fL (ref 80.0–100.0)
Monocytes Absolute: 0.9 10*3/uL (ref 0.1–1.0)
Monocytes Relative: 8 %
Neutro Abs: 8.4 10*3/uL — ABNORMAL HIGH (ref 1.7–7.7)
Neutrophils Relative %: 74 %
Platelets: 301 10*3/uL (ref 150–400)
RBC: 4.72 MIL/uL (ref 3.87–5.11)
RDW: 12.8 % (ref 11.5–15.5)
WBC: 11.3 10*3/uL — ABNORMAL HIGH (ref 4.0–10.5)
nRBC: 0 % (ref 0.0–0.2)

## 2021-04-25 LAB — COMPREHENSIVE METABOLIC PANEL
ALT: 17 U/L (ref 0–44)
AST: 15 U/L (ref 15–41)
Albumin: 4.4 g/dL (ref 3.5–5.0)
Alkaline Phosphatase: 76 U/L (ref 38–126)
Anion gap: 7 (ref 5–15)
BUN: 11 mg/dL (ref 6–20)
CO2: 28 mmol/L (ref 22–32)
Calcium: 9.2 mg/dL (ref 8.9–10.3)
Chloride: 101 mmol/L (ref 98–111)
Creatinine, Ser: 0.66 mg/dL (ref 0.44–1.00)
GFR, Estimated: >60 mL/min (ref >60)
Glucose, Bld: 114 mg/dL — ABNORMAL HIGH (ref 70–99)
Potassium: 4 mmol/L (ref 3.5–5.1)
Sodium: 136 mmol/L (ref 135–145)
Total Bilirubin: 0.6 mg/dL (ref 0.3–1.2)
Total Protein: 8.3 g/dL — ABNORMAL HIGH (ref 6.5–8.1)

## 2021-04-25 LAB — SARS CORONAVIRUS 2 (TAT 6-24 HRS): SARS Coronavirus 2: NEGATIVE

## 2021-04-25 NOTE — Discharge Instructions (Addendum)
Call neuro to let them know of results of scan  No abnormality seen  Cautious with sitting to standing  Stay hydrated well

## 2021-04-25 NOTE — ED Triage Notes (Signed)
Pt c/o pre-syncopal episode this morning. Pt reports possibly getting out of bed too quickly. Pt states she had sudden onset of lightheadedness, nausea, cold sweats and some tunnel vision. Pt states these have improved but she does still have some lightheadedness, also some possible increase in her anxiety. Pt states she recently had a neurological procedure done. Pt denies any current pain.

## 2021-04-25 NOTE — ED Provider Notes (Signed)
[facility]    CSN: [id number] Arrival date & time: 04/25/21  1205      History   Chief Complaint Chief Complaint  Patient presents with   pre-syncopal episode    HPI Carol Porter is a 30 y.o. female.   On 04/18/2021 had a vascular stent placed due to intercranial hypertension. Pt was d/c on fri. This am took dog out and when coming back in walking up the steps began to feel syncope. Pt called her neurologist and they wanted pt to have some lab work and ekg completed. Pt has a hx of pvc does not believe that this was a cause. Deneis any fevers, no cough, congestion. No sob.  Pt is on plavix and asa.    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) 04/12/2020   OSA on CPAP 11/17/2018   Positive ANA (antinuclear antibody) 08/11/2018   PCOS (polycystic ovarian syndrome) 06/14/2018   Inappropriate sinus tachycardia 11/04/2017   Mild intermittent asthma without complication 58/85/0277   ADHD, predominantly inattentive type 10/22/2015   Other specified eating disorder 08/31/2015   Anxiety disorder, unspecified 08/03/2015   Moderate episode of recurrent major depressive disorder ([place]) 08/03/2015   Unspecified hemorrhoids 08/03/2015   Classical migraine with intractable migraine 06/30/2014   Epigastric pain 05/12/2014   History of irritable bowel syndrome 05/12/2014    Past Surgical History:  Procedure Laterality Date   LUMBAR PUNCTURE      OB History   No obstetric history on file.      Home Medications    Prior to Admission medications   Medication Sig Start Date End Date Taking? Authorizing Provider   Albuterol Sulfate (PROAIR RESPICLICK) 412 (90 Base) MCG/ACT AEPB Take by mouth. 06/14/15  Yes [provider]  aspirin 81 MG EC tablet Take by mouth.   Yes [provider]  clopidogrel (PLAVIX) 75 MG tablet Take 1 tablet (75 mg total) by mouth daily. Start date: 9/15 03/04/21  Yes   FLUoxetine (PROZAC) 10 MG tablet Take 1.5 tablets (15 mg total) by mouth once daily 04/11/21  Yes   ranitidine (ZANTAC) 75 MG tablet Take 75 mg by mouth as needed.    Yes [provider]  acetaminophen-codeine (TYLENOL #3) 300-30 MG tablet Take 1 tablet by mouth every 4 (four) hours as needed for up to 7 days for Pain. 04/19/21     acetaZOLAMIDE (DIAMOX) 250 MG tablet Take by mouth. 09/14/20 06/16/21  [provider]  Botulinum Toxin Type A (BOTOX) 200 units SOLR INJECT 200 UNITS INTO THE MUSCLES OF MULTIPLE SITES OF THE FACE AND NECK BY PHYSICIAN ONCE EVERY 3 MONTHS. 01/16/21     Drospirenone (SLYND) 4 MG TABS Take 1 tablet by mouth daily, skip placebo pills and start next pack for continuous dosing 04/04/21     EPINEPHrine (EPIPEN 2-PAK) 0.3 mg/0.3 mL IJ SOAJ injection Inject intramuscularly in the event of an emergent allergic reaction with difficulty breathing. 04/15/21     FLUoxetine (PROZAC) 10 MG tablet Take 1 and 1/2 tablets by mouth once daily 03/22/21     Fluoxetine HCl, PMDD, 10 MG TABS  Take by mouth. 04/11/21   [provider]  fluticasone (FLONASE) 50 MCG/ACT nasal spray Place 1 spray into both nostrils daily. 11/07/20   Raspet, Derry Skill, PA-C  hydrocortisone (ANUSOL-HC) 25 MG suppository Place rectally. 05/02/20   [provider]  Magnesium Bisglycinate (MAG GLYCINATE) 100 MG TABS Take by mouth.    [provider]  melatonin 3 MG TABS tablet Take by mouth.    [provider]  senna-docusate (SENOKOT-S) 8.6-50 MG tablet Take 2 tablets by mouth nightly. 04/19/21     triamcinolone (NASACORT) 55 MCG/ACT AERO nasal inhaler Use 2 sprays in each nostril  once daily 04/15/21     sucralfate (CARAFATE) 1 g tablet Take 1 tablet by mouth 4 (four) times daily. 09/30/17 11/07/20  [provider]  SYEDA 3-0.03 MG tablet Take 1 tablet by mouth daily. 09/30/17 11/07/20  [provider]    Family History History reviewed. No pertinent family history.  Social History Social History   Tobacco Use   Smoking status: Never   Smokeless tobacco: Never  Vaping Use   Vaping Use: Never used  Substance Use Topics   Alcohol use: Yes    Comment: social   Drug use: No     Allergies   Omeprazole, Flagyl [metronidazole], Oxycodone-acetaminophen, Phentermine, and Sulfamethoxazole-trimethoprim   Review of Systems Review of Systems  Constitutional:  Negative for chills and fever.  HENT: Negative.    Eyes: Negative.   Respiratory: Negative.    Cardiovascular: Negative.   Genitourinary: Negative.   Neurological:  Positive for syncope.       None currently was taking out dog and became syncope     Physical Exam Triage Vital Signs ED Triage Vitals  Enc Vitals Group     BP 04/25/21 1221 136/75     Pulse Rate 04/25/21 1221 (!) 111     Resp 04/25/21 1221 18     Temp 04/25/21 1221 98.3 F (36.8 C)     Temp Source 04/25/21 1221 Oral     SpO2 04/25/21 1221 100 %     Weight 04/25/21 1217 280 lb (127 kg)     Height 04/25/21 1217 5\' 9"  (1.753 m)     Head Circumference --      Peak Flow --      Pain Score 04/25/21 1217 0     Pain Loc --      Pain Edu? --      Excl. in [place]? --    Orthostatic VS for the past 24 hrs:  BP- Lying Pulse- Lying BP- Sitting Pulse- Sitting BP- Standing at 0 minutes Pulse- Standing at 0 minutes  04/25/21 1300 126/80 108 118/80 100 101/60 110    Updated Vital Signs BP 136/75 (BP Location: Left Arm)   Pulse (!) 111   Temp 98.3 F (36.8 C) (Oral)   Resp 18   Ht 5\' 9"  (1.753 m)   Wt 280 lb (127 kg)   LMP 04/11/2021   SpO2 100%   BMI 41.35 kg/m   Visual Acuity Right Eye Distance:   Left Eye Distance:    Bilateral Distance:    Right Eye Near:   Left Eye Near:    Bilateral Near:     Physical Exam Constitutional:      Appearance: Normal appearance.  HENT:     Nose: Nose normal.  Eyes:     Pupils: Pupils are equal, round, and reactive to light.  Cardiovascular:     Rate and Rhythm: Tachycardia  present.  Pulmonary:     Effort: Pulmonary effort is normal.  Abdominal:     General: Abdomen is flat.  Musculoskeletal:        General: Normal range of motion.     Cervical back: Normal range of motion.  Skin:    General: Skin is warm.  Neurological:     General: No focal deficit present.     Mental Status: She is alert.     UC Treatments / Results  Labs (all labs ordered are listed, but only abnormal results are displayed) Labs Reviewed  CBC WITH DIFFERENTIAL/PLATELET - Abnormal; Notable for the following components:      Result Value   WBC 11.3 (*)    Neutro Abs 8.4 (*)    All other components within normal limits  COMPREHENSIVE METABOLIC PANEL - Abnormal; Notable for the following components:   Glucose, Bld 114 (*)    Total Protein 8.3 (*)    All other components within normal limits  SARS CORONAVIRUS 2 (TAT 6-24 HRS)    EKG   Radiology CT Head Wo Contrast  Result Date: 04/25/2021 CLINICAL DATA:  Intracranial shunt placement, follow-up; surgery 04/16/2021 for intracranial hypertension. EXAM: CT HEAD WITHOUT CONTRAST TECHNIQUE: Contiguous axial images were obtained from the base of the skull through the vertex without intravenous contrast. COMPARISON:  No pertinent prior exams available for comparison. FINDINGS: Brain: Cerebral volume is normal. There is no acute intracranial hemorrhage. No demarcated cortical infarct. No extra-axial fluid collection. No evidence of an intracranial mass. No midline shift. Partially empty sella turcica. Vascular: A vascular stent is present along the course of the right transverse and sigmoid dural venous sinuses. Elsewhere, there is no  hyperdense vessel. Skull: Normal. Negative for fracture or focal lesion. Sinuses/Orbits: Visualized orbits show no acute finding. No significant paranasal sinus disease at the imaged levels. IMPRESSION: No evidence of acute intracranial abnormality. A vascular stent is present along the course of the right transverse and sigmoid dural venous sinuses. Partially empty sella turcica. These findings are compatible with the provided history of idiopathic intracranial hypertension. Electronically Signed   By: Kellie Simmering D.O.   On: 04/25/2021 14:11    Procedures Procedures (including critical care time)  Medications Ordered in UC Medications - No data to display  Initial Impression / Assessment and Plan / UC Course  I have reviewed the triage vital signs and the nursing notes.  Pertinent labs & imaging results that were available during my care of the patient were reviewed by me and considered in my medical decision making (see chart for details).     Discussed with pt about orthostatic causes this may cause some of the syncope post surgery  Stay hydrated well  Contact neurology to let them know of test and follow up care  Final Clinical Impressions(s) / UC Diagnoses   Final diagnoses:  Syncope, unspecified syncope type     Discharge Instructions      Call neuro to let them know of results of scan  No abnormality seen  Cautious with sitting to standing  Stay hydrated well        ED Prescriptions   None    PDMP not reviewed this encounter.   Marney Setting, NP 04/25/21 1428

## 2021-05-02 DIAGNOSIS — Z7902 Long term (current) use of antithrombotics/antiplatelets: Secondary | ICD-10-CM | POA: Diagnosis not present

## 2021-05-02 DIAGNOSIS — Z7982 Long term (current) use of aspirin: Secondary | ICD-10-CM | POA: Diagnosis not present

## 2021-05-02 DIAGNOSIS — Z48811 Encounter for surgical aftercare following surgery on the nervous system: Secondary | ICD-10-CM | POA: Diagnosis not present

## 2021-05-02 DIAGNOSIS — Z95828 Presence of other vascular implants and grafts: Secondary | ICD-10-CM | POA: Diagnosis not present

## 2021-05-06 ENCOUNTER — Other Ambulatory Visit (HOSPITAL_COMMUNITY): Payer: Self-pay

## 2021-05-07 ENCOUNTER — Other Ambulatory Visit (HOSPITAL_COMMUNITY): Payer: Self-pay

## 2021-05-08 ENCOUNTER — Other Ambulatory Visit (HOSPITAL_COMMUNITY): Payer: Self-pay

## 2021-05-09 ENCOUNTER — Other Ambulatory Visit (HOSPITAL_COMMUNITY): Payer: Self-pay

## 2021-05-13 ENCOUNTER — Other Ambulatory Visit (HOSPITAL_COMMUNITY): Payer: Self-pay

## 2021-05-15 ENCOUNTER — Other Ambulatory Visit (HOSPITAL_COMMUNITY): Payer: Self-pay

## 2021-05-20 ENCOUNTER — Other Ambulatory Visit (HOSPITAL_COMMUNITY): Payer: Self-pay

## 2021-05-21 DIAGNOSIS — Z7689 Persons encountering health services in other specified circumstances: Secondary | ICD-10-CM | POA: Diagnosis not present

## 2021-05-21 DIAGNOSIS — Z91013 Allergy to seafood: Secondary | ICD-10-CM | POA: Diagnosis not present

## 2021-05-24 ENCOUNTER — Other Ambulatory Visit: Payer: Self-pay

## 2021-06-03 ENCOUNTER — Encounter: Payer: Self-pay | Admitting: Podiatry

## 2021-06-03 ENCOUNTER — Other Ambulatory Visit (HOSPITAL_COMMUNITY): Payer: Self-pay

## 2021-06-03 ENCOUNTER — Ambulatory Visit (INDEPENDENT_AMBULATORY_CARE_PROVIDER_SITE_OTHER): Payer: 59 | Admitting: Podiatry

## 2021-06-03 ENCOUNTER — Other Ambulatory Visit: Payer: Self-pay

## 2021-06-03 DIAGNOSIS — M722 Plantar fascial fibromatosis: Secondary | ICD-10-CM

## 2021-06-03 NOTE — Progress Notes (Signed)
Patient presents today to pick up custom molded foot orthotics, diagnosed with plantar fasciitis by Dr. Milinda Pointer.   Orthotics were dispensed. Written break-in instructions given to patient.  Patient will follow up as needed.

## 2021-06-05 DIAGNOSIS — G4733 Obstructive sleep apnea (adult) (pediatric): Secondary | ICD-10-CM | POA: Diagnosis not present

## 2021-06-07 ENCOUNTER — Other Ambulatory Visit: Payer: Self-pay

## 2021-06-07 DIAGNOSIS — Z7902 Long term (current) use of antithrombotics/antiplatelets: Secondary | ICD-10-CM | POA: Diagnosis not present

## 2021-06-07 DIAGNOSIS — Z7982 Long term (current) use of aspirin: Secondary | ICD-10-CM | POA: Diagnosis not present

## 2021-06-07 DIAGNOSIS — Z95828 Presence of other vascular implants and grafts: Secondary | ICD-10-CM | POA: Diagnosis not present

## 2021-06-07 DIAGNOSIS — G4733 Obstructive sleep apnea (adult) (pediatric): Secondary | ICD-10-CM | POA: Diagnosis not present

## 2021-06-07 DIAGNOSIS — R519 Headache, unspecified: Secondary | ICD-10-CM | POA: Diagnosis not present

## 2021-06-07 DIAGNOSIS — Z9989 Dependence on other enabling machines and devices: Secondary | ICD-10-CM | POA: Diagnosis not present

## 2021-06-07 MED ORDER — FUROSEMIDE 20 MG PO TABS
ORAL_TABLET | ORAL | 1 refills | Status: AC
  Filled 2021-06-07 – 2021-07-02 (×2): qty 60, 30d supply, fill #0

## 2021-06-07 MED ORDER — TOPIRAMATE 50 MG PO TABS
ORAL_TABLET | ORAL | 0 refills | Status: DC
  Filled 2021-06-07 – 2021-07-02 (×2): qty 42, 28d supply, fill #0

## 2021-06-10 DIAGNOSIS — R635 Abnormal weight gain: Secondary | ICD-10-CM | POA: Diagnosis not present

## 2021-06-10 DIAGNOSIS — E559 Vitamin D deficiency, unspecified: Secondary | ICD-10-CM | POA: Diagnosis not present

## 2021-06-10 DIAGNOSIS — Z6841 Body Mass Index (BMI) 40.0 and over, adult: Secondary | ICD-10-CM | POA: Diagnosis not present

## 2021-06-10 DIAGNOSIS — N951 Menopausal and female climacteric states: Secondary | ICD-10-CM | POA: Diagnosis not present

## 2021-06-10 DIAGNOSIS — E611 Iron deficiency: Secondary | ICD-10-CM | POA: Diagnosis not present

## 2021-06-11 DIAGNOSIS — G43719 Chronic migraine without aura, intractable, without status migrainosus: Secondary | ICD-10-CM | POA: Diagnosis not present

## 2021-06-12 ENCOUNTER — Other Ambulatory Visit (HOSPITAL_COMMUNITY): Payer: Self-pay

## 2021-06-13 DIAGNOSIS — G4733 Obstructive sleep apnea (adult) (pediatric): Secondary | ICD-10-CM | POA: Diagnosis not present

## 2021-06-14 ENCOUNTER — Ambulatory Visit: Payer: Self-pay

## 2021-06-17 ENCOUNTER — Ambulatory Visit
Admission: RE | Admit: 2021-06-17 | Discharge: 2021-06-17 | Disposition: A | Discharge status: Home / Self Care | Payer: 59 | Source: Ambulatory Visit | Attending: Internal Medicine | Admitting: Internal Medicine

## 2021-06-17 ENCOUNTER — Other Ambulatory Visit: Payer: Self-pay

## 2021-06-17 VITALS — BP 128/65 | HR 100 | Temp 98.5°F | Resp 16 | Ht 69.0 in | Wt 280.0 lb

## 2021-06-17 DIAGNOSIS — R509 Fever, unspecified: Secondary | ICD-10-CM | POA: Insufficient documentation

## 2021-06-17 DIAGNOSIS — Z8616 Personal history of COVID-19: Secondary | ICD-10-CM | POA: Diagnosis not present

## 2021-06-17 DIAGNOSIS — R52 Pain, unspecified: Secondary | ICD-10-CM | POA: Diagnosis not present

## 2021-06-17 DIAGNOSIS — Z1331 Encounter for screening for depression: Secondary | ICD-10-CM | POA: Diagnosis not present

## 2021-06-17 DIAGNOSIS — Z6841 Body Mass Index (BMI) 40.0 and over, adult: Secondary | ICD-10-CM | POA: Diagnosis not present

## 2021-06-17 DIAGNOSIS — E611 Iron deficiency: Secondary | ICD-10-CM | POA: Diagnosis not present

## 2021-06-17 DIAGNOSIS — E559 Vitamin D deficiency, unspecified: Secondary | ICD-10-CM | POA: Diagnosis not present

## 2021-06-17 DIAGNOSIS — N951 Menopausal and female climacteric states: Secondary | ICD-10-CM | POA: Diagnosis not present

## 2021-06-17 DIAGNOSIS — Z20822 Contact with and (suspected) exposure to covid-19: Secondary | ICD-10-CM | POA: Insufficient documentation

## 2021-06-17 DIAGNOSIS — J09X2 Influenza due to identified novel influenza A virus with other respiratory manifestations: Secondary | ICD-10-CM | POA: Insufficient documentation

## 2021-06-17 DIAGNOSIS — R635 Abnormal weight gain: Secondary | ICD-10-CM | POA: Diagnosis not present

## 2021-06-17 DIAGNOSIS — M2550 Pain in unspecified joint: Secondary | ICD-10-CM | POA: Diagnosis not present

## 2021-06-17 DIAGNOSIS — Z1339 Encounter for screening examination for other mental health and behavioral disorders: Secondary | ICD-10-CM | POA: Diagnosis not present

## 2021-06-17 DIAGNOSIS — G479 Sleep disorder, unspecified: Secondary | ICD-10-CM | POA: Diagnosis not present

## 2021-06-17 LAB — RAPID INFLUENZA A&B ANTIGENS
Influenza A (ARMC): NEGATIVE
Influenza B (ARMC): NEGATIVE

## 2021-06-17 LAB — RESP PANEL BY RT-PCR (FLU A&B, COVID) ARPGX2
Influenza A by PCR: POSITIVE — AB
Influenza B by PCR: NEGATIVE
SARS Coronavirus 2 by RT PCR: NEGATIVE

## 2021-06-17 LAB — PREGNANCY, URINE: Preg Test, Ur: NEGATIVE

## 2021-06-17 MED ORDER — XOFLUZA (80 MG DOSE) 2 X 40 MG PO TBPK: 80.0000 mg | ORAL_TABLET | Freq: Once | ORAL | 0 refills | Status: DC

## 2021-06-17 MED ORDER — BENZONATATE 200 MG PO CAPS: 200.0000 mg | ORAL_CAPSULE | Freq: Three times a day (TID) | ORAL | 0 refills | Status: DC | PRN

## 2021-06-17 MED ORDER — OSELTAMIVIR PHOSPHATE 75 MG PO CAPS: 75.0000 mg | ORAL_CAPSULE | Freq: Two times a day (BID) | ORAL | 0 refills | Status: DC

## 2021-06-17 MED ORDER — ONDANSETRON 8 MG PO TBDP: 8.0000 mg | ORAL_TABLET | Freq: Three times a day (TID) | ORAL | 0 refills | Status: DC | PRN

## 2021-06-17 NOTE — ED Provider Notes (Signed)
[facility]    CSN: [id number] Arrival date & time: 06/17/21  0803      History   Chief Complaint Chief Complaint  Patient presents with   Generalized Body Aches   Fever    HPI Carol Porter is a 30 y.o. female presents with onset of body aches, fever, of 102. ST, nausea without vomiting and HA. Has taken Tylenol this am. Denies diarrhea. 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 Her husband is well, but had a co-worker who had a fever last week.    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) 04/12/2020   OSA on CPAP 11/17/2018   Positive ANA (antinuclear antibody) 08/11/2018   PCOS (polycystic ovarian syndrome) 06/14/2018   Inappropriate sinus tachycardia 11/04/2017   Mild intermittent asthma without complication 81/85/6314   ADHD, predominantly inattentive type 10/22/2015   Other specified eating disorder 08/31/2015   Anxiety disorder, unspecified 08/03/2015   Moderate episode of recurrent major depressive disorder ([place]) 08/03/2015   Unspecified hemorrhoids 08/03/2015   Classical migraine with intractable migraine 06/30/2014   Epigastric pain 05/12/2014   History of irritable bowel syndrome 05/12/2014    Past Surgical History:  Procedure Laterality Date   LUMBAR PUNCTURE      OB History   No obstetric history on file.      Home Medications    Prior to Admission medications   Medication Sig Start Date End Date Taking? Authorizing Provider  benzonatate (TESSALON) 200 MG capsule Take 1 capsule (200 mg  total) by mouth 3 (three) times daily as needed for cough. 06/17/21  Yes Rodriguez-Southworth, Sunday Spillers, PA-C  ondansetron (ZOFRAN ODT) 8 MG disintegrating tablet Take 1 tablet (8 mg total) by mouth every 8 (eight) hours as needed for nausea or vomiting. 06/17/21  Yes Rodriguez-Southworth, Sunday Spillers, PA-C  oseltamivir (TAMIFLU) 75 MG capsule Take 1 capsule (75 mg total) by mouth every 12 (twelve) hours. If out, fill xoflusa 06/17/21  Yes Rodriguez-Southworth, Sunday Spillers, PA-C  Albuterol Sulfate (PROAIR RESPICLICK) 970 (90 Base) MCG/ACT AEPB Take by mouth. 06/14/15   [provider]  aspirin 81 MG EC tablet Take by mouth.    [provider]  Botulinum Toxin Type A (BOTOX) 200 units SOLR INJECT 200 UNITS INTO THE MUSCLES OF MULTIPLE SITES OF THE FACE AND NECK BY PHYSICIAN ONCE EVERY 3 MONTHS. 01/16/21     clopidogrel (PLAVIX) 75 MG tablet Take 1 tablet (75 mg total) by mouth daily. Start date: 9/15 03/04/21     Drospirenone (SLYND) 4 MG TABS Take 1 tablet by mouth daily, skip placebo pills and start next pack for continuous dosing 04/04/21     EPINEPHrine (EPIPEN 2-PAK) 0.3 mg/0.3 mL IJ SOAJ injection Inject intramuscularly in the event of an emergent allergic reaction with difficulty breathing. 04/15/21     FLUoxetine (PROZAC) 10 MG tablet Take 1 and 1/2 tablets by mouth once daily 03/22/21     FLUoxetine (PROZAC) 10 MG tablet Take 1.5 tablets (15 mg total) by mouth once  daily 04/11/21     Fluoxetine HCl, PMDD, 10 MG TABS Take by mouth. 04/11/21   [provider]  fluticasone (FLONASE) 50 MCG/ACT nasal spray Place 1 spray into both nostrils daily. 11/07/20   Raspet, Derry Skill, PA-C  furosemide (LASIX) 20 MG tablet Take 1 tablet (20 mg total) by mouth 2 (two)  times daily as needed. 06/07/21     hydrocortisone (ANUSOL-HC) 25 MG suppository Place rectally. 05/02/20   [provider]  Magnesium Bisglycinate (MAG GLYCINATE) 100 MG TABS Take by mouth.    [provider]  melatonin 3  MG TABS tablet Take by mouth.    [provider]  ranitidine (ZANTAC) 75 MG tablet Take 75 mg by mouth as needed.     [provider]  senna-docusate (SENOKOT-S) 8.6-50 MG tablet Take 2 tablets by mouth nightly. 04/19/21     topiramate (TOPAMAX) 50 MG tablet Take 1/2 tablet (25 mg total) by mouth 2 times daily for 14 days, THEN 1 tablet (50 mg total) 2 times daily for 14 days. 06/07/21     triamcinolone (NASACORT) 55 MCG/ACT AERO nasal inhaler Use 2 sprays in each nostril once daily 04/15/21     sucralfate (CARAFATE) 1 g tablet Take 1 tablet by mouth 4 (four) times daily. 09/30/17 11/07/20  [provider]  SYEDA 3-0.03 MG tablet Take 1 tablet by mouth daily. 09/30/17 11/07/20  [provider]    Family History History reviewed. No pertinent family history.  Social History Social History   Tobacco Use   Smoking status: Never   Smokeless tobacco: Never  Vaping Use   Vaping Use: Never used  Substance Use Topics   Alcohol use: Yes    Comment: social   Drug use: No     Allergies   Omeprazole, Flagyl [metronidazole], Oxycodone-acetaminophen, Phentermine, and Sulfamethoxazole-trimethoprim   Review of Systems Review of Systems  Constitutional:  Positive for appetite change, chills and fever.  HENT:  Positive for postnasal drip, rhinorrhea and sore throat. Negative for ear discharge, ear pain and trouble swallowing.   Eyes:  Negative for discharge.  Respiratory:  Positive for cough.   Gastrointestinal:  Positive for nausea. Negative for diarrhea and vomiting.  Musculoskeletal:  Positive for myalgias. Negative for gait problem.  Skin:  Negative for rash.  Neurological:  Positive for headaches.  Hematological:  Negative for adenopathy.    Physical Exam Triage Vital Signs ED Triage Vitals  Enc Vitals Group     BP 06/17/21 0820 128/65     Pulse Rate 06/17/21 0820 100     Resp 06/17/21 0820 16     Temp 06/17/21 0820 98.5 F (36.9 C)     Temp Source  06/17/21 0820 Oral     SpO2 06/17/21 0820 100 %     Weight 06/17/21 0821 279 lb 15.8 oz (127 kg)     Height 06/17/21 0821 5\' 9"  (1.753 m)     Head Circumference --      Peak Flow --      Pain Score 06/17/21 0815 3     Pain Loc --      Pain Edu? --      Excl. in [place]? --    No data found.  Updated Vital Signs BP 128/65 (BP Location: Left Arm)   Pulse 100   Temp 98.5 F (36.9 C) (Oral)   Resp 16   Ht 5\' 9"  (1.753 m)   Wt 279 lb 15.8 oz (127 kg)  LMP 05/24/2021   SpO2 100%   BMI 41.35 kg/m   Visual Acuity Right Eye Distance:   Left Eye Distance:   Bilateral Distance:    Right Eye Near:   Left Eye Near:    Bilateral Near:      Physical Exam Vitals signs and nursing note reviewed.  Constitutional:      General: She is not in acute distress.    Appearance: Normal appearance. She is not ill-appearing, toxic-appearing or diaphoretic.  HENT:     Head: Normocephalic.     Right Ear: Tympanic membrane, ear canal and external ear normal.     Left Ear: Tympanic membrane, ear canal and external ear normal.     Nose: with clear mucous    Mouth/Throat: clear    Mouth: Mucous membranes are moist.  Eyes:     General: No scleral icterus.       Right eye: No discharge.        Left eye: No discharge.     Conjunctiva/sclera: Conjunctivae normal.  Neck:     Musculoskeletal: Neck supple. No neck rigidity.  Cardiovascular:     Rate and Rhythm: Normal rate and regular rhythm.     Heart sounds: No murmur.  Pulmonary:     Effort: Pulmonary effort is normal.     Breath sounds: Normal breath sounds.  Musculoskeletal: Normal range of motion.  Lymphadenopathy:     Cervical: No cervical adenopathy.  Skin:    General: Skin is warm and dry.     Coloration: Skin is not jaundiced.     Findings: No rash.  Neurological:     Mental Status: She is alert and oriented to person, place, and time.     Gait: Gait normal.  Psychiatric:        Mood and Affect: Mood normal.        Behavior:  Behavior normal.        Thought Content: Thought content normal.        Judgment: Judgment normal.    UC Treatments / Results  Labs (all labs ordered are listed, but only abnormal results are displayed) Labs Reviewed  RESP PANEL BY RT-PCR (FLU A&B, COVID) ARPGX2 - Abnormal; Notable for the following components:      Result Value   Influenza A by PCR POSITIVE (*)    All other components within normal limits  RAPID INFLUENZA A&B ANTIGENS  PREGNANCY, URINE  Pregnancy test is neg. Covid test is neg.   EKG   Radiology No results found.  Procedures Procedures (including critical care time)  Medications Ordered in UC Medications - No data to display  Initial Impression / Assessment and Plan / UC Course  I have reviewed the triage vital signs and the nursing notes. Pertinent labs  results that were available during my care of the patient were reviewed by me and considered in my medical decision making (see chart for details). Influenza A I placed pt on Tamiflu, Tessalon and Zofran as noted.    Final Clinical Impressions(s) / UC Diagnoses   Final diagnoses:  Influenza due to identified novel influenza A virus with other respiratory manifestations     Discharge Instructions      I am ordering Covid and PCR flu and I will call you when the results are done.      ED Prescriptions     Medication Sig Dispense Auth. Provider   benzonatate (TESSALON) 200 MG capsule Take 1 capsule (200 mg total) by mouth  3 (three) times daily as needed for cough. 30 capsule Rodriguez-Southworth, Sunday Spillers, PA-C   oseltamivir (TAMIFLU) 75 MG capsule Take 1 capsule (75 mg total) by mouth every 12 (twelve) hours. If out, fill xoflusa 10 capsule Rodriguez-Southworth, Sunday Spillers, PA-C   Baloxavir Marboxil,80 MG Dose, (XOFLUZA, 80 MG DOSE,) 2 x 40 MG TBPK  (Status: Discontinued) Take 80 mg by mouth once for 1 dose. 1 each Rodriguez-Southworth, Sunday Spillers, PA-C   ondansetron (ZOFRAN ODT) 8 MG disintegrating  tablet Take 1 tablet (8 mg total) by mouth every 8 (eight) hours as needed for nausea or vomiting. 20 tablet Rodriguez-Southworth, Sunday Spillers, PA-C      PDMP not reviewed this encounter.   Shelby Mattocks, PA-C 06/17/21 1259

## 2021-06-17 NOTE — ED Triage Notes (Signed)
Pt started last night with body aches, sore throat, fever, nausea, headache.

## 2021-06-17 NOTE — Discharge Instructions (Addendum)
I am ordering Covid and PCR flu and I will call you when the results are done.

## 2021-06-19 ENCOUNTER — Telehealth: Payer: Self-pay | Admitting: Internal Medicine

## 2021-06-19 MED ORDER — PREDNISONE 10 MG (21) PO TBPK: ORAL_TABLET | Freq: Every day | ORAL | 0 refills | Status: DC

## 2021-06-19 MED ORDER — AZITHROMYCIN 250 MG PO TABS: ORAL_TABLET | ORAL | 0 refills | Status: DC

## 2021-06-19 NOTE — Telephone Encounter (Signed)
I checked on pt, and she mentioned she coughed some blood this am, none since. Has not had a fever today, but did last night. Has hx of asthma and feels she may need to get on prednisone.  She is supposed to be leaving town tomorrow, so in case she develops a secondary infection I sent Zpack to her pharmacy and Prednisone as noted. I offered her to get a CXR, but since the hemoptysis has not reoccurred, she declined.

## 2021-06-24 ENCOUNTER — Other Ambulatory Visit: Payer: Self-pay

## 2021-07-01 DIAGNOSIS — Z6841 Body Mass Index (BMI) 40.0 and over, adult: Secondary | ICD-10-CM | POA: Diagnosis not present

## 2021-07-01 DIAGNOSIS — E611 Iron deficiency: Secondary | ICD-10-CM | POA: Diagnosis not present

## 2021-07-02 ENCOUNTER — Other Ambulatory Visit (HOSPITAL_COMMUNITY): Payer: Self-pay

## 2021-07-03 ENCOUNTER — Other Ambulatory Visit (HOSPITAL_COMMUNITY): Payer: Self-pay

## 2021-07-08 DIAGNOSIS — G932 Benign intracranial hypertension: Secondary | ICD-10-CM | POA: Diagnosis not present

## 2021-07-08 DIAGNOSIS — H526 Other disorders of refraction: Secondary | ICD-10-CM | POA: Diagnosis not present

## 2021-07-13 DIAGNOSIS — G4733 Obstructive sleep apnea (adult) (pediatric): Secondary | ICD-10-CM | POA: Diagnosis not present

## 2021-07-25 ENCOUNTER — Other Ambulatory Visit (HOSPITAL_COMMUNITY): Payer: Self-pay

## 2021-07-29 ENCOUNTER — Other Ambulatory Visit (HOSPITAL_COMMUNITY): Payer: Self-pay

## 2021-07-30 ENCOUNTER — Other Ambulatory Visit (HOSPITAL_COMMUNITY): Payer: Self-pay

## 2021-07-31 ENCOUNTER — Other Ambulatory Visit (HOSPITAL_COMMUNITY): Payer: Self-pay

## 2021-08-01 ENCOUNTER — Other Ambulatory Visit (HOSPITAL_COMMUNITY): Payer: Self-pay

## 2021-08-02 ENCOUNTER — Other Ambulatory Visit (HOSPITAL_COMMUNITY): Payer: Self-pay

## 2021-08-02 MED ORDER — BOTOX 200 UNITS IJ SOLR
INTRAMUSCULAR | 3 refills | Status: DC
  Filled 2021-08-02: qty 1, 90d supply, fill #0
  Filled 2022-02-18: qty 1, 84d supply, fill #0

## 2021-08-07 ENCOUNTER — Ambulatory Visit
Admission: EM | Admit: 2021-08-07 | Discharge: 2021-08-07 | Disposition: A | Discharge status: Home / Self Care | Payer: 59 | Attending: Internal Medicine | Admitting: Internal Medicine

## 2021-08-07 DIAGNOSIS — Z1152 Encounter for screening for COVID-19: Secondary | ICD-10-CM | POA: Diagnosis not present

## 2021-08-07 DIAGNOSIS — Z20822 Contact with and (suspected) exposure to covid-19: Secondary | ICD-10-CM | POA: Diagnosis not present

## 2021-08-07 NOTE — ED Triage Notes (Addendum)
Pt here with COVID exposure over 10 days ago.

## 2021-08-08 LAB — NOVEL CORONAVIRUS, NAA: SARS-CoV-2, NAA: NOT DETECTED

## 2021-08-08 LAB — SARS-COV-2, NAA 2 DAY TAT

## 2021-08-09 ENCOUNTER — Other Ambulatory Visit (HOSPITAL_COMMUNITY): Payer: Self-pay

## 2021-08-13 DIAGNOSIS — G4733 Obstructive sleep apnea (adult) (pediatric): Secondary | ICD-10-CM | POA: Diagnosis not present

## 2021-08-15 ENCOUNTER — Other Ambulatory Visit (HOSPITAL_COMMUNITY): Payer: Self-pay

## 2021-08-20 DIAGNOSIS — D2362 Other benign neoplasm of skin of left upper limb, including shoulder: Secondary | ICD-10-CM | POA: Diagnosis not present

## 2021-08-20 DIAGNOSIS — D485 Neoplasm of uncertain behavior of skin: Secondary | ICD-10-CM | POA: Diagnosis not present

## 2021-08-21 DIAGNOSIS — R238 Other skin changes: Secondary | ICD-10-CM | POA: Diagnosis not present

## 2021-08-23 ENCOUNTER — Other Ambulatory Visit: Payer: Self-pay

## 2021-08-23 ENCOUNTER — Ambulatory Visit
Admission: EM | Admit: 2021-08-23 | Discharge: 2021-08-23 | Disposition: A | Discharge status: Home / Self Care | Payer: 59 | Attending: Internal Medicine | Admitting: Internal Medicine

## 2021-08-23 DIAGNOSIS — Z1152 Encounter for screening for COVID-19: Secondary | ICD-10-CM | POA: Insufficient documentation

## 2021-08-23 LAB — RESP PANEL BY RT-PCR (FLU A&B, COVID) ARPGX2
Influenza A by PCR: NEGATIVE
Influenza B by PCR: NEGATIVE
SARS Coronavirus 2 by RT PCR: NEGATIVE

## 2021-08-23 NOTE — ED Triage Notes (Signed)
Nurse visit screening for covid.

## 2021-08-30 ENCOUNTER — Other Ambulatory Visit: Payer: Self-pay

## 2021-08-30 ENCOUNTER — Ambulatory Visit
Admission: EM | Admit: 2021-08-30 | Discharge: 2021-08-30 | Disposition: A | Discharge status: Home / Self Care | Payer: 59 | Attending: Emergency Medicine | Admitting: Emergency Medicine

## 2021-08-30 ENCOUNTER — Encounter: Payer: Self-pay | Admitting: Emergency Medicine

## 2021-08-30 ENCOUNTER — Ambulatory Visit (INDEPENDENT_AMBULATORY_CARE_PROVIDER_SITE_OTHER): Payer: 59

## 2021-08-30 DIAGNOSIS — R059 Cough, unspecified: Secondary | ICD-10-CM

## 2021-08-30 DIAGNOSIS — R051 Acute cough: Secondary | ICD-10-CM | POA: Diagnosis not present

## 2021-08-30 DIAGNOSIS — R0989 Other specified symptoms and signs involving the circulatory and respiratory systems: Secondary | ICD-10-CM | POA: Diagnosis not present

## 2021-08-30 NOTE — ED Provider Notes (Signed)
[facility]    CSN: [id number] Arrival date & time: 08/30/21  0954      History   Chief Complaint Chief Complaint  Patient presents with   Cough    HPI Zyliah Schier is a 31 y.o. female.   Patient presents with non productive cough for 1.5 weeks. Cough became productive 1 day ago. Has attempted use of otc medications, with no relief. History of pneumonia. Possible sick contacts as she works in [employer].   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) 04/12/2020   OSA on CPAP 11/17/2018   Positive ANA (antinuclear antibody) 08/11/2018   PCOS (polycystic ovarian syndrome) 06/14/2018   Inappropriate sinus tachycardia 11/04/2017   Mild intermittent asthma without complication 99/37/1696   ADHD, predominantly inattentive type 10/22/2015   Other specified eating disorder 08/31/2015   Anxiety disorder, unspecified 08/03/2015   Moderate episode of recurrent major depressive disorder ([place]) 08/03/2015   Unspecified hemorrhoids 08/03/2015   Classical migraine with intractable migraine 06/30/2014   Epigastric pain 05/12/2014   History of irritable bowel syndrome 05/12/2014    Past Surgical History:  Procedure Laterality Date   LUMBAR PUNCTURE      OB History   No obstetric history on file.      Home Medications    Prior to Admission medications   Medication Sig Start Date End Date Taking? Authorizing Provider  aspirin 81 MG EC tablet Take by mouth.   Yes [provider]  FLUoxetine (PROZAC) 10 MG tablet Take 1.5 tablets (15 mg total) by mouth once daily 04/11/21  Yes   ranitidine (ZANTAC) 75 MG  tablet Take 75 mg by mouth as needed.    Yes [provider]  Albuterol Sulfate (PROAIR RESPICLICK) 789 (90 Base) MCG/ACT AEPB Take by mouth. 06/14/15   [provider]  azithromycin (ZITHROMAX Z-PAK) 250 MG tablet Take 2 on day one, then one qd x 4 days 06/19/21   Rodriguez-Southworth, Sunday Spillers, PA-C  benzonatate (TESSALON) 200 MG capsule Take 1 capsule (200 mg total) by mouth 3 (three) times daily as needed for cough. 06/17/21   Rodriguez-Southworth, Sunday Spillers, PA-C  Botulinum Toxin Type A (BOTOX) 200 units SOLR INJECT 200 UNITS INTO THE MUSCLES OF MULTIPLE SITES OF THE FACE AND NECK BY PHYSICIAN ONCE EVERY 3 MONTHS. 01/16/21     Botulinum Toxin Type A (BOTOX) 200 units SOLR INJECT 200 UNITS INTO THE MUSCLES OF MULTIPLE SITES OF THE FACE AND NECK BY PHYSICIAN ONCE EVERY 3 MONTHS. 08/02/21     clopidogrel (PLAVIX) 75 MG tablet Take 1 tablet (75 mg total) by mouth daily. Start date: 9/15 03/04/21     Drospirenone (SLYND) 4 MG TABS Take 1 tablet by mouth daily, skip placebo pills and start next pack for continuous dosing 04/04/21     EPINEPHrine (EPIPEN 2-PAK) 0.3 mg/0.3 mL IJ SOAJ injection Inject intramuscularly in the event of an emergent allergic reaction with difficulty breathing. 04/15/21     FLUoxetine (PROZAC) 10 MG tablet Take 1 and 1/2 tablets by mouth once daily 03/22/21     Fluoxetine HCl, PMDD, 10 MG TABS Take by mouth. 04/11/21  [provider]  fluticasone (FLONASE) 50 MCG/ACT nasal spray Place 1 spray into both nostrils daily. 11/07/20   Raspet, Derry Skill, PA-C  furosemide (LASIX) 20 MG tablet Take 1 tablet (20 mg total) by mouth 2 (two)  times daily as needed. 06/07/21     hydrocortisone (ANUSOL-HC) 25 MG suppository Place rectally. 05/02/20   [provider]  Magnesium Bisglycinate (MAG GLYCINATE) 100 MG TABS Take by mouth.    [provider]  melatonin 3 MG TABS tablet Take by mouth.    [provider]  ondansetron (ZOFRAN ODT) 8 MG disintegrating  tablet Take 1 tablet (8 mg total) by mouth every 8 (eight) hours as needed for nausea or vomiting. 06/17/21   Rodriguez-Southworth, Sunday Spillers, PA-C  oseltamivir (TAMIFLU) 75 MG capsule Take 1 capsule (75 mg total) by mouth every 12 (twelve) hours. If out, fill xoflusa 06/17/21   Rodriguez-Southworth, Sunday Spillers, PA-C  predniSONE (STERAPRED UNI-PAK 21 TAB) 10 MG (21) TBPK tablet Take by mouth daily. Take 6 tabs by mouth daily  for 2 days, then 5 tabs for 2 days, then 4 tabs for 2 days, then 3 tabs for 2 days, 2 tabs for 2 days, then 1 tab by mouth daily for 2 days 06/19/21   Rodriguez-Southworth, Sunday Spillers, PA-C  senna-docusate (SENOKOT-S) 8.6-50 MG tablet Take 2 tablets by mouth nightly. 04/19/21     topiramate (TOPAMAX) 50 MG tablet Take 1/2 tablet (25 mg total) by mouth 2 times daily for 14 days, THEN 1 tablet (50 mg total) 2 times daily for 14 days. 06/07/21     triamcinolone (NASACORT) 55 MCG/ACT AERO nasal inhaler Use 2 sprays in each nostril once daily 04/15/21     sucralfate (CARAFATE) 1 g tablet Take 1 tablet by mouth 4 (four) times daily. 09/30/17 11/07/20  [provider]  SYEDA 3-0.03 MG tablet Take 1 tablet by mouth daily. 09/30/17 11/07/20  [provider]    Family History History reviewed. No pertinent family history.  Social History Social History   Tobacco Use   Smoking status: Never   Smokeless tobacco: Never  Vaping Use   Vaping Use: Never used  Substance Use Topics   Alcohol use: Yes    Comment: social   Drug use: No     Allergies   Omeprazole, Flagyl [metronidazole], Oxycodone-acetaminophen, Phentermine, and Sulfamethoxazole-trimethoprim   Review of Systems Review of Systems  Constitutional: Negative.   HENT: Negative.    Respiratory:  Positive for cough. Negative for apnea, choking, chest tightness, shortness of breath, wheezing and stridor.   Skin: Negative.     Physical Exam Triage Vital Signs ED Triage Vitals  Enc Vitals Group     BP 08/30/21 1326  (!) 115/57     Pulse Rate 08/30/21 1326 65     Resp 08/30/21 1326 15     Temp 08/30/21 1326 98.1 F (36.7 C)     Temp Source 08/30/21 1326 Oral     SpO2 08/30/21 1326 99 %     Weight 08/30/21 1322 279 lb 15.8 oz (127 kg)     Height 08/30/21 1322 5\' 9"  (1.753 m)     Head Circumference --      Peak Flow --      Pain Score 08/30/21 1322 0     Pain Loc --      Pain Edu? --      Excl. in [place]? --    No data found.  Updated Vital Signs BP (!) 115/57 (BP Location:  Left Arm)    Pulse 65    Temp 98.1 F (36.7 C) (Oral)    Resp 15    Ht 5\' 9"  (1.753 m)    Wt 279 lb 15.8 oz (127 kg)    LMP 08/16/2021 (Approximate)    SpO2 99%    BMI 41.35 kg/m   Visual Acuity Right Eye Distance:   Left Eye Distance:   Bilateral Distance:    Right Eye Near:   Left Eye Near:    Bilateral Near:     Physical Exam Constitutional:      Appearance: Normal appearance.  HENT:     Head: Normocephalic.  Eyes:     Extraocular Movements: Extraocular movements intact.  Cardiovascular:     Pulses: Normal pulses.     Heart sounds: Normal heart sounds.  Pulmonary:     Effort: Pulmonary effort is normal.     Breath sounds: Normal breath sounds.  Neurological:     Mental Status: She is alert and oriented to person, place, and time. Mental status is at baseline.  Psychiatric:        Mood and Affect: Mood normal.        Behavior: Behavior normal.     UC Treatments / Results  Labs (all labs ordered are listed, but only abnormal results are displayed) Labs Reviewed - No data to display  EKG   Radiology DG Chest 2 View  Result Date: 08/30/2021 CLINICAL DATA:  Persistent cough with crackles EXAM: CHEST - 2 VIEW COMPARISON:  11/07/2020 FINDINGS: The heart size and mediastinal contours are within normal limits. Both lungs are clear. The visualized skeletal structures are unremarkable. IMPRESSION: No active cardiopulmonary disease. Electronically Signed   By: Kathreen Devoid M.D.   On: 08/30/2021 13:18     Procedures Procedures (including critical care time)  Medications Ordered in UC Medications - No data to display  Initial Impression / Assessment and Plan / UC Course  I have reviewed the triage vital signs and the nursing notes.  Pertinent labs & imaging results that were available during my care of the patient were reviewed by me and considered in my medical decision making (see chart for details).  Acute cough   Chest x-ray negative,dicussed findings with patient, would like to continue supportive care, in agreement with plan of care, may follow up as needed  Final Clinical Impressions(s) / UC Diagnoses   Final diagnoses:  Acute cough   Discharge Instructions   None    ED Prescriptions   None    PDMP not reviewed this encounter.   Hans Eden, [place] 08/30/21 [id number]

## 2021-08-30 NOTE — ED Triage Notes (Signed)
Patient c/o cough and chest congestion for a week.  Patient denies fevers.  Patient reports coughing up thick white and yellowish sputum.

## 2021-09-03 ENCOUNTER — Other Ambulatory Visit (HOSPITAL_COMMUNITY): Payer: Self-pay

## 2021-09-11 ENCOUNTER — Other Ambulatory Visit (HOSPITAL_COMMUNITY): Payer: Self-pay

## 2021-09-12 ENCOUNTER — Other Ambulatory Visit (HOSPITAL_COMMUNITY): Payer: Self-pay

## 2021-09-12 ENCOUNTER — Other Ambulatory Visit: Payer: Self-pay

## 2021-09-21 ENCOUNTER — Ambulatory Visit: Payer: Self-pay

## 2021-09-22 ENCOUNTER — Ambulatory Visit
Admission: EM | Admit: 2021-09-22 | Discharge: 2021-09-22 | Disposition: A | Discharge status: Home / Self Care | Payer: 59 | Attending: Emergency Medicine | Admitting: Emergency Medicine

## 2021-09-22 ENCOUNTER — Other Ambulatory Visit: Payer: Self-pay

## 2021-09-22 DIAGNOSIS — H1031 Unspecified acute conjunctivitis, right eye: Secondary | ICD-10-CM | POA: Insufficient documentation

## 2021-09-22 DIAGNOSIS — Z20822 Contact with and (suspected) exposure to covid-19: Secondary | ICD-10-CM | POA: Insufficient documentation

## 2021-09-22 DIAGNOSIS — J069 Acute upper respiratory infection, unspecified: Secondary | ICD-10-CM | POA: Diagnosis not present

## 2021-09-22 LAB — RESP PANEL BY RT-PCR (FLU A&B, COVID) ARPGX2
Influenza A by PCR: NEGATIVE
Influenza B by PCR: NEGATIVE
SARS Coronavirus 2 by RT PCR: NEGATIVE

## 2021-09-22 LAB — GROUP A STREP BY PCR: Group A Strep by PCR: NOT DETECTED

## 2021-09-22 MED ORDER — BENZONATATE 100 MG PO CAPS: 200.0000 mg | ORAL_CAPSULE | Freq: Three times a day (TID) | ORAL | 0 refills | Status: DC

## 2021-09-22 MED ORDER — PROMETHAZINE-DM 6.25-15 MG/5ML PO SYRP: 5.0000 mL | ORAL_SOLUTION | Freq: Four times a day (QID) | ORAL | 0 refills | Status: DC | PRN

## 2021-09-22 MED ORDER — MOXIFLOXACIN HCL 0.5 % OP SOLN: 1.0000 [drp] | Freq: Three times a day (TID) | OPHTHALMIC | 0 refills | Status: AC

## 2021-09-22 MED ORDER — IPRATROPIUM BROMIDE 0.06 % NA SOLN: 2.0000 | Freq: Four times a day (QID) | NASAL | 12 refills | Status: DC

## 2021-09-22 NOTE — Discharge Instructions (Addendum)
Use the Atrovent nasal spray, 2 squirts in each nostril every 6 hours, as needed for runny nose and postnasal drip.  Use the Tessalon Perles every 8 hours during the day.  Take them with a small sip of water.  They may give you some numbness to the base of your tongue or a metallic taste in your mouth, this is normal.  Use the Promethazine DM cough syrup at bedtime for cough and congestion.  It will make you drowsy so do not take it during the day.  Tylenol and ibuprofen according to the package instructions as needed for pain.  Continue to use your albuterol inhaler as needed for shortness of breath or wheezing.  Instill 1 drop of Vigamox in each eye every 8 hours for the next 7 days for treatment of your conjunctivitis.  Avoid touching your eyes as much as possible.  Wipe down all surfaces, countertops, and doorknobs after the first and second 24 hours on eyedrops.  Wash her face with a clean wash rag to remove any drainage and use a different portion of the wash rag to clean each eye so as to not reinfect yourself.  Return for reevaluation or see your primary care provider for any new or worsening symptoms.

## 2021-09-22 NOTE — ED Triage Notes (Signed)
Patient is here for Exposure to strep. Daughter is + to strep. S/t started about 2 days ago. Cough "has been about 1 wk or so". COVID19 test Negative.

## 2021-09-22 NOTE — ED Provider Notes (Signed)
[facility]    CSN: [id number] Arrival date & time: 09/22/21  0801      History   Chief Complaint Chief Complaint  Patient presents with   Sore Throat    HPI Chasitie Passey is a 31 y.o. female.   HPI  31 year old female here for evaluation of respiratory complaints.  Patient was that she has been experiencing a cough for the last 3 days that is required on the use of her rescue inhaler.  She reports that it is productive for palpitating sputum.  She also endorses some mild runny nose, nasal congestion, ear pressure, fever with a Tmax of 100.3, night sweats, nausea, and shortness of breath.  She developed a sore throat 2 days ago.  Her daughter is currently being treated for strep.  This morning she woke up with her right eye matted shut with a mucopurulent discharge but she denies any itching of the eye.  No GI symptoms or wheezing.  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) 04/12/2020   OSA on CPAP 11/17/2018   Positive ANA (antinuclear antibody) 08/11/2018   PCOS (polycystic ovarian syndrome) 06/14/2018   Inappropriate sinus tachycardia 11/04/2017   Mild intermittent asthma without complication 41/96/2229   ADHD, predominantly inattentive type 10/22/2015   Other specified eating disorder 08/31/2015   Anxiety disorder, unspecified 08/03/2015   Moderate episode of recurrent major depressive disorder ([place]) 08/03/2015   Unspecified hemorrhoids 08/03/2015   Classical migraine with intractable migraine 06/30/2014   Epigastric pain 05/12/2014   History of irritable bowel syndrome 05/12/2014    Past Surgical  History:  Procedure Laterality Date   LUMBAR PUNCTURE      OB History   No obstetric history on file.      Home Medications    Prior to Admission medications   Medication Sig Start Date End Date Taking? Authorizing Provider  Albuterol Sulfate (PROAIR RESPICLICK) 798 (90 Base) MCG/ACT AEPB Take by mouth. 06/14/15  Yes [provider]  aspirin 81 MG EC tablet Take by mouth.   Yes [provider]  benzonatate (TESSALON) 100 MG capsule Take 2 capsules (200 mg total) by mouth every 8 (eight) hours. 09/22/21  Yes Margarette Canada, NP  FLUoxetine (PROZAC) 10 MG tablet Take 1 and 1/2 tablets by mouth once daily 03/22/21  Yes   fluticasone (FLONASE) 50 MCG/ACT nasal spray Place 1 spray into both nostrils daily. 11/07/20  Yes Raspet, Erin K, PA-C  ipratropium (ATROVENT) 0.06 % nasal spray Place 2 sprays into both nostrils 4 (four) times daily. 09/22/21  Yes Margarette Canada, NP  ondansetron (ZOFRAN ODT) 8 MG disintegrating tablet Take 1 tablet (8 mg total) by mouth every 8 (eight) hours as needed for nausea or vomiting. 06/17/21  Yes Rodriguez-Southworth, Sunday Spillers, PA-C  promethazine-dextromethorphan (PROMETHAZINE-DM) 6.25-15 MG/5ML syrup Take 5 mLs by mouth 4 (four) times daily as needed. 09/22/21  Yes Margarette Canada, NP  ranitidine (ZANTAC) 75 MG tablet Take 75 mg by mouth as needed.    Yes [provider]  triamcinolone (NASACORT) 55 MCG/ACT AERO nasal inhaler Use 2 sprays in each nostril once daily 04/15/21  Yes  Botulinum Toxin Type A (BOTOX) 200 units SOLR INJECT 200 UNITS INTO THE MUSCLES OF MULTIPLE SITES OF THE FACE AND NECK BY PHYSICIAN ONCE EVERY 3 MONTHS. 01/16/21     Botulinum Toxin Type A (BOTOX) 200 units SOLR INJECT 200 UNITS INTO THE MUSCLES OF MULTIPLE SITES OF THE FACE AND NECK BY PHYSICIAN ONCE EVERY 3 MONTHS. 08/02/21     EPINEPHrine (EPIPEN 2-PAK) 0.3 mg/0.3 mL IJ SOAJ injection Inject intramuscularly in the event of an emergent allergic reaction with difficulty  breathing. 04/15/21     furosemide (LASIX) 20 MG tablet Take 1 tablet (20 mg total) by mouth 2 (two)  times daily as needed. 06/07/21     hydrocortisone (ANUSOL-HC) 25 MG suppository Place rectally. 05/02/20   [provider]  Magnesium Bisglycinate (MAG GLYCINATE) 100 MG TABS Take by mouth.    [provider]  sucralfate (CARAFATE) 1 g tablet Take 1 tablet by mouth 4 (four) times daily. 09/30/17 11/07/20  [provider]  SYEDA 3-0.03 MG tablet Take 1 tablet by mouth daily. 09/30/17 11/07/20  [provider]    Family History No family history on file.  Social History Social History   Tobacco Use   Smoking status: Never   Smokeless tobacco: Never  Vaping Use   Vaping Use: Never used  Substance Use Topics   Alcohol use: Yes    Comment: social   Drug use: No     Allergies   Omeprazole, Flagyl [metronidazole], Oxycodone-acetaminophen, Phentermine, and Sulfamethoxazole-trimethoprim   Review of Systems Review of Systems  Constitutional:  Positive for diaphoresis and fever. Negative for activity change and appetite change.  HENT:  Positive for congestion, ear pain, rhinorrhea and sore throat. Negative for ear discharge.   Eyes:  Positive for discharge and redness. Negative for itching.  Respiratory:  Positive for cough and shortness of breath. Negative for wheezing.   Gastrointestinal:  Negative for diarrhea, nausea and vomiting.  Hematological: Negative.   Psychiatric/Behavioral: Negative.      Physical Exam Triage Vital Signs ED Triage Vitals  Enc Vitals Group     BP 09/22/21 0810 (!) 116/57     Pulse Rate 09/22/21 0810 78     Resp 09/22/21 0810 18     Temp 09/22/21 0810 98.4 F (36.9 C)     Temp Source 09/22/21 0810 Oral     SpO2 09/22/21 0810 99 %     Weight 09/22/21 0805 290 lb (131.5 kg)     Height 09/22/21 0805 5\' 9"  (1.753 m)     Head Circumference --      Peak Flow --      Pain Score 09/22/21 0805 7     Pain Loc --      Pain  Edu? --      Excl. in [place]? --    No data found.  Updated Vital Signs BP (!) 116/57 (BP Location: Left Arm)    Pulse 78    Temp 98.4 F (36.9 C) (Oral)    Resp 18    Ht 5\' 9"  (1.753 m)    Wt 290 lb (131.5 kg)    LMP 09/17/2021 (Exact Date)    SpO2 99%    BMI 42.83 kg/m   Visual Acuity Right Eye Distance:   Left Eye Distance:   Bilateral Distance:    Right Eye Near:   Left Eye Near:    Bilateral Near:     Physical Exam Vitals and nursing note reviewed.  Constitutional:  Appearance: Normal appearance. She is not ill-appearing.  HENT:     Head: Normocephalic and atraumatic.     Right Ear: Tympanic membrane, ear canal and external ear normal. There is no impacted cerumen.     Left Ear: Tympanic membrane, ear canal and external ear normal. There is no impacted cerumen.     Nose: Congestion and rhinorrhea present.     Mouth/Throat:     Mouth: Mucous membranes are moist.     Pharynx: Oropharynx is clear. Posterior oropharyngeal erythema present. No oropharyngeal exudate.  Eyes:     General:        Right eye: Discharge present.     Pupils: Pupils are equal, round, and reactive to light.  Cardiovascular:     Rate and Rhythm: Normal rate and regular rhythm.     Pulses: Normal pulses.     Heart sounds: Normal heart sounds. No murmur heard.   No friction rub. No gallop.  Pulmonary:     Effort: Pulmonary effort is normal.     Breath sounds: Normal breath sounds. No wheezing, rhonchi or rales.  Musculoskeletal:     Cervical back: Neck supple.  Lymphadenopathy:     Cervical: No cervical adenopathy.  Skin:    General: Skin is warm and dry.     Capillary Refill: Capillary refill takes less than 2 seconds.     Findings: No erythema or rash.  Neurological:     General: No focal deficit present.     Mental Status: She is alert and oriented to person, place, and time.  Psychiatric:        Mood and Affect: Mood normal.        Behavior: Behavior normal.        Thought Content:  Thought content normal.        Judgment: Judgment normal.     UC Treatments / Results  Labs (all labs ordered are listed, but only abnormal results are displayed) Labs Reviewed  GROUP A STREP BY PCR  RESP PANEL BY RT-PCR (FLU A&B, COVID) [id number]    EKG   Radiology No results found.  Procedures Procedures (including critical care time)  Medications Ordered in UC Medications - No data to display  Initial Impression / Assessment and Plan / UC Course  I have reviewed the triage vital signs and the nursing notes.  Pertinent labs & imaging results that were available during my care of the patient were reviewed by me and considered in my medical decision making (see chart for details).  Patient is a very pleasant, nontoxic-appearing 31 year old female here for evaluation of respiratory complaints as outlined in the HPI above.  On exam patient is pearly gray tympanic membranes bilaterally with normal light reflex and clear external auditory canals.  Her right eye has erythematous and injected bulbar and labral conjunctiva.  She has normal red light reflex and her pupils equal round and reactive.  No discharge noted but patient reports mucopurulent discharge in her eye being matted shut this morning when she woke up.  Nasal mucosa is erythematous and mildly edematous with scant clear discharge in both nares.  Oropharyngeal exam reveals very mild posterior oropharyngeal erythema with some erythema to the soft palate and uvula.  No exudate noted.  No cervical lymphadenopathy appreciated on exam.  Cardiopulmonary exam feels clung sounds in all fields.  Given her strep exposure we will check strep PCR and also check respiratory triplex panel.  Strep PCR is negative.  Respiratory triplex panel is  negative for COVID or influenza.  We will discharge patient home with a diagnosis of a viral URI with a cough.  I will provide Atrovent nasal spray, Tessalon Perles, and Promethazine DM cough syrup for  patient to use for symptoms management.  Tylenol and ibuprofen as needed for fever or body aches.  Return precautions reviewed.   Final Clinical Impressions(s) / UC Diagnoses   Final diagnoses:  Viral URI with cough     Discharge Instructions      Use the Atrovent nasal spray, 2 squirts in each nostril every 6 hours, as needed for runny nose and postnasal drip.  Use the Tessalon Perles every 8 hours during the day.  Take them with a small sip of water.  They may give you some numbness to the base of your tongue or a metallic taste in your mouth, this is normal.  Use the Promethazine DM cough syrup at bedtime for cough and congestion.  It will make you drowsy so do not take it during the day.  Tylenol and ibuprofen according to the package instructions as needed for pain.  Continue to use your albuterol inhaler as needed for shortness of breath or wheezing.  Return for reevaluation or see your primary care provider for any new or worsening symptoms.      ED Prescriptions     Medication Sig Dispense Auth. Provider   benzonatate (TESSALON) 100 MG capsule Take 2 capsules (200 mg total) by mouth every 8 (eight) hours. 21 capsule Margarette Canada, NP   ipratropium (ATROVENT) 0.06 % nasal spray Place 2 sprays into both nostrils 4 (four) times daily. 15 mL Margarette Canada, NP   promethazine-dextromethorphan (PROMETHAZINE-DM) 6.25-15 MG/5ML syrup Take 5 mLs by mouth 4 (four) times daily as needed. 240 mL Margarette Canada, NP      PDMP not reviewed this encounter.   Margarette Canada, NP 09/22/21 [phone]

## 2021-10-01 ENCOUNTER — Other Ambulatory Visit: Payer: Self-pay

## 2021-10-01 ENCOUNTER — Ambulatory Visit
Admission: EM | Admit: 2021-10-01 | Discharge: 2021-10-01 | Disposition: A | Discharge status: Home / Self Care | Payer: 59 | Attending: Emergency Medicine | Admitting: Emergency Medicine

## 2021-10-01 DIAGNOSIS — K529 Noninfective gastroenteritis and colitis, unspecified: Secondary | ICD-10-CM | POA: Diagnosis not present

## 2021-10-01 LAB — COMPREHENSIVE METABOLIC PANEL
ALT: 17 U/L (ref 0–44)
AST: 15 U/L (ref 15–41)
Albumin: 4 g/dL (ref 3.5–5.0)
Alkaline Phosphatase: 65 U/L (ref 38–126)
Anion gap: 11 (ref 5–15)
BUN: 14 mg/dL (ref 6–20)
CO2: 23 mmol/L (ref 22–32)
Calcium: 8.3 mg/dL — ABNORMAL LOW (ref 8.9–10.3)
Chloride: 102 mmol/L (ref 98–111)
Creatinine, Ser: 0.64 mg/dL (ref 0.44–1.00)
GFR, Estimated: >60 mL/min (ref >60)
Glucose, Bld: 106 mg/dL — ABNORMAL HIGH (ref 70–99)
Potassium: 3.4 mmol/L — ABNORMAL LOW (ref 3.5–5.1)
Sodium: 136 mmol/L (ref 135–145)
Total Bilirubin: 0.4 mg/dL (ref 0.3–1.2)
Total Protein: 7.3 g/dL (ref 6.5–8.1)

## 2021-10-01 LAB — CBC WITH DIFFERENTIAL/PLATELET
Abs Immature Granulocytes: 0.05 10*3/uL (ref 0.00–0.07)
Basophils Absolute: 0 10*3/uL (ref 0.0–0.1)
Basophils Relative: 0 %
Eosinophils Absolute: 0 10*3/uL (ref 0.0–0.5)
Eosinophils Relative: 0 %
HCT: 38.5 % (ref 36.0–46.0)
Hemoglobin: 12.4 g/dL (ref 12.0–15.0)
Immature Granulocytes: 1 %
Lymphocytes Relative: 7 %
Lymphs Abs: 0.8 10*3/uL (ref 0.7–4.0)
MCH: 27.9 pg (ref 26.0–34.0)
MCHC: 32.2 g/dL (ref 30.0–36.0)
MCV: 86.5 fL (ref 80.0–100.0)
Monocytes Absolute: 0.6 10*3/uL (ref 0.1–1.0)
Monocytes Relative: 6 %
Neutro Abs: 8.9 10*3/uL — ABNORMAL HIGH (ref 1.7–7.7)
Neutrophils Relative %: 86 %
Platelets: 265 10*3/uL (ref 150–400)
RBC: 4.45 MIL/uL (ref 3.87–5.11)
RDW: 13.4 % (ref 11.5–15.5)
WBC: 10.3 10*3/uL (ref 4.0–10.5)
nRBC: 0 % (ref 0.0–0.2)

## 2021-10-01 MED ORDER — PROMETHAZINE HCL 12.5 MG RE SUPP
12.5000 mg | Freq: Four times a day (QID) | RECTAL | 2 refills | Status: AC | PRN
Start: 2021-10-01 — End: ?

## 2021-10-01 MED ORDER — ONDANSETRON 4 MG PO TBDP
4.0000 mg | ORAL_TABLET | Freq: Three times a day (TID) | ORAL | 0 refills | Status: DC | PRN
Start: 2021-10-01 — End: 2022-05-15

## 2021-10-01 MED ORDER — ONDANSETRON HCL 4 MG/2ML IJ SOLN: 8.0000 mg | Freq: Once | INTRAMUSCULAR | Status: DC

## 2021-10-01 MED ORDER — SODIUM CHLORIDE 0.9 % IV BOLUS
1000.0000 mL | Freq: Once | INTRAVENOUS | Status: AC
  Administered 2021-10-01: 1000 mL via INTRAVENOUS

## 2021-10-01 MED ORDER — ONDANSETRON HCL 4 MG/2ML IJ SOLN
8.0000 mg | Freq: Once | INTRAMUSCULAR | Status: AC
  Administered 2021-10-01: 8 mg via INTRAVENOUS

## 2021-10-01 NOTE — ED Provider Notes (Signed)
[facility]    CSN: [id number] Arrival date & time: 10/01/21  0800      History   Chief Complaint Chief Complaint  Patient presents with   Vomiting   Diarrhea    HPI Carol Porter is a 31 y.o. female.   HPI  31 year old female here for evaluation of GI complaints.  Patient reports prostituted been sick with norovirus and she developed nausea and vomiting last night.  She reports 10+ episodes of emesis and 14-15 episodes of diarrhea.  She denies blood in either 1.  She also denies abdominal pain.  She has had some dizziness, headache, and had a presyncopal episode last night.  She denies nausea at present and states her dizziness is resolved.  Patient has a history denies any intracranial hypertension.  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) 04/12/2020   OSA on CPAP 11/17/2018   Positive ANA (antinuclear antibody) 08/11/2018   PCOS (polycystic ovarian syndrome) 06/14/2018   Inappropriate sinus tachycardia 11/04/2017   Mild intermittent asthma without complication 96/22/2979   ADHD, predominantly inattentive type 10/22/2015   Other specified eating disorder 08/31/2015   Anxiety disorder, unspecified 08/03/2015   Moderate episode of recurrent major depressive disorder ([place]) 08/03/2015   Unspecified hemorrhoids 08/03/2015   Classical migraine with intractable migraine 06/30/2014   Epigastric pain 05/12/2014   History of irritable bowel syndrome 05/12/2014    Past Surgical History:  Procedure Laterality Date   LUMBAR PUNCTURE      OB History   No obstetric history on file.      Home  Medications    Prior to Admission medications   Medication Sig Start Date End Date Taking? Authorizing Provider  Albuterol Sulfate (PROAIR RESPICLICK) 892 (90 Base) MCG/ACT AEPB Take by mouth. 06/14/15  Yes [provider]  aspirin 81 MG EC tablet Take by mouth.   Yes [provider]  Botulinum Toxin Type A (BOTOX) 200 units SOLR INJECT 200 UNITS INTO THE MUSCLES OF MULTIPLE SITES OF THE FACE AND NECK BY PHYSICIAN ONCE EVERY 3 MONTHS. 01/16/21  Yes   Botulinum Toxin Type A (BOTOX) 200 units SOLR INJECT 200 UNITS INTO THE MUSCLES OF MULTIPLE SITES OF THE FACE AND NECK BY PHYSICIAN ONCE EVERY 3 MONTHS. 08/02/21  Yes   EPINEPHrine (EPIPEN 2-PAK) 0.3 mg/0.3 mL IJ SOAJ injection Inject intramuscularly in the event of an emergent allergic reaction with difficulty breathing. 04/15/21  Yes   FLUoxetine (PROZAC) 10 MG tablet Take 1 and 1/2 tablets by mouth once daily 03/22/21  Yes   furosemide (LASIX) 20 MG tablet Take 1 tablet (20 mg total) by mouth 2 (two)  times daily as needed. 06/07/21  Yes   hydrocortisone (ANUSOL-HC) 25 MG suppository Place rectally. 05/02/20  Yes [provider]  ipratropium (ATROVENT) 0.06 % nasal spray Place 2 sprays into both nostrils 4 (four) times daily. 09/22/21  Yes Margarette Canada, NP  Magnesium Bisglycinate (MAG GLYCINATE) 100 MG TABS Take by mouth.   Yes [provider]  ondansetron (ZOFRAN ODT) 8 MG disintegrating tablet Take 1 tablet (8 mg total) by mouth every 8 (eight) hours  as needed for nausea or vomiting. 06/17/21  Yes Rodriguez-Southworth, Sunday Spillers, PA-C  ondansetron (ZOFRAN-ODT) 4 MG disintegrating tablet Take 1 tablet (4 mg total) by mouth every 8 (eight) hours as needed for nausea or vomiting. 10/01/21  Yes Margarette Canada, NP  promethazine (PHENERGAN) 12.5 MG suppository Place 1 suppository (12.5 mg total) rectally every 6 (six) hours as needed for nausea or vomiting. 10/01/21  Yes Margarette Canada, NP  ranitidine (ZANTAC) 75 MG tablet Take 75 mg by  mouth as needed.    Yes [provider]  triamcinolone (NASACORT) 55 MCG/ACT AERO nasal inhaler Use 2 sprays in each nostril once daily 04/15/21  Yes   sucralfate (CARAFATE) 1 g tablet Take 1 tablet by mouth 4 (four) times daily. 09/30/17 11/07/20  [provider]  SYEDA 3-0.03 MG tablet Take 1 tablet by mouth daily. 09/30/17 11/07/20  [provider]    Family History History reviewed. No pertinent family history.  Social History Social History   Tobacco Use   Smoking status: Never   Smokeless tobacco: Never  Vaping Use   Vaping Use: Never used  Substance Use Topics   Alcohol use: Yes    Comment: social   Drug use: No     Allergies   Omeprazole, Flagyl [metronidazole], Oxycodone-acetaminophen, Phentermine, and Sulfamethoxazole-trimethoprim   Review of Systems Review of Systems  Constitutional:  Negative for fever.  Gastrointestinal:  Positive for diarrhea, nausea and vomiting. Negative for abdominal pain.  Neurological:  Positive for dizziness and headaches. Negative for syncope.  Hematological: Negative.   Psychiatric/Behavioral: Negative.      Physical Exam Triage Vital Signs ED Triage Vitals [10/01/21 0807]  Enc Vitals Group     BP      Pulse      Resp      Temp      Temp src      SpO2      Weight 285 lb (129.3 kg)     Height '5\' 9"'$  (1.753 m)     Head Circumference      Peak Flow      Pain Score 3     Pain Loc      Pain Edu?      Excl. in [place]?    No data found.  Updated Vital Signs BP 125/60 (BP Location: Left Arm)    Pulse (!) 107    Temp 99.3 F (37.4 C) (Oral)    Resp 18    Ht '5\' 9"'$  (1.753 m)    Wt 285 lb (129.3 kg)    LMP 09/17/2021 (Exact Date)    SpO2 99%    BMI 42.09 kg/m   Visual Acuity Right Eye Distance:   Left Eye Distance:   Bilateral Distance:    Right Eye Near:   Left Eye Near:    Bilateral Near:     Physical Exam Vitals and nursing note reviewed.  Constitutional:      Appearance: Normal appearance. She is  not ill-appearing.  HENT:     Head: Normocephalic and atraumatic.     Mouth/Throat:     Mouth: Mucous membranes are moist.     Pharynx: Oropharynx is clear.  Cardiovascular:     Rate and Rhythm: Normal rate and regular rhythm.     Pulses: Normal pulses.     Heart sounds: Normal heart sounds. No murmur heard.   No friction rub. No gallop.  Pulmonary:     Effort: Pulmonary effort is normal.     Breath  sounds: Normal breath sounds. No wheezing, rhonchi or rales.  Skin:    General: Skin is warm and dry.     Capillary Refill: Capillary refill takes less than 2 seconds.     Findings: No erythema or rash.  Neurological:     General: No focal deficit present.     Mental Status: She is alert and oriented to person, place, and time.  Psychiatric:        Mood and Affect: Mood normal.        Behavior: Behavior normal.        Thought Content: Thought content normal.        Judgment: Judgment normal.     UC Treatments / Results  Labs (all labs ordered are listed, but only abnormal results are displayed) Labs Reviewed  COMPREHENSIVE METABOLIC PANEL - Abnormal; Notable for the following components:      Result Value   Potassium 3.4 (*)    Glucose, Bld 106 (*)    Calcium 8.3 (*)    All other components within normal limits  CBC WITH DIFFERENTIAL/PLATELET - Abnormal; Notable for the following components:   Neutro Abs 8.9 (*)    All other components within normal limits    EKG   Radiology No results found.  Procedures Procedures (including critical care time)  Medications Ordered in UC Medications  sodium chloride 0.9 % bolus 1,000 mL (0 mLs Intravenous Stopped 10/01/21 1051)  ondansetron (ZOFRAN) injection 8 mg (8 mg Intravenous Given 10/01/21 0842)    Initial Impression / Assessment and Plan / UC Course  I have reviewed the triage vital signs and the nursing notes.  Pertinent labs & imaging results that were available during my care of the patient were reviewed by me and  considered in my medical decision making (see chart for details).  Patient is a nontoxic-appearing 31 year old female here for evaluation of nausea and vomiting that began last night.  Stated HPI above, she has had proximately episodes of the vomiting and reported 50 episodes of diarrhea.  She denies blood in the lung.  No abdominal pain.  On exam patient has a benign cardiopulmonary exam with S1-S2 heart sounds with regular. Lung sounds are clear to auscultation.  Patient does endorse a mild headache and had a presyncopal episode and dizziness last night.  Dizziness has resolved.  I will order an IV fluid bolus of normal saline, Zofran IV 8 mg IV, and check a CMP.  Following fluid bolus and antiemetics will check fluid challenge prior to discharge.  Plan to discharge home with Phenergan suppositories to control emesis and vomiting at home.  CBC shows a mildly increased neutrophil number of 8.9 but is otherwise unremarkable.  CMP shows a mild hypokalemia with a K of 3.4.  Glucose is mildly elevated at 106 and calcium is mildly decreased at 8.3.  Transaminases are normal as is renal function.  IV fluids continue to infuse and patient is able to tolerate p.o.  She states that she is feeling better with the IV fluid.  She is approximately 400 mL of fluid left.  Plan is to discharge home with new prescription for Zofran as she is almost out as well as prescription for Phenergan suppositories that she can use should the Zofran not be effective.  I have suggested a clear liquid diet for the next 12 hours to see how she tolerates that then she can advance her diet there as tolerated.   Final Clinical Impressions(s) / UC Diagnoses  Final diagnoses:  Gastroenteritis     Discharge Instructions      Take the Zofran every 8 hours as needed for nausea and vomiting.  They are an oral disintegrating tablet and you can place them on her under your tongue and then will be absorbed.  If the Zofran is not  working for control of your nausea you can use the Phenergan suppositories.  You can take 1 every 6 hours.  These can be inserted either vaginally or rectally as a will be absorbed the same way.  Follow a clear liquid diet for the next 6 to 12 hours.  Clear liquids consist of broth, ginger ale, water, Pedialyte, and Jell-O.  After 6 to 12 hours, if you are tolerating clear liquids, you can advance to bland foods such as bananas, rice, applesauce, and toast.  If you tolerate bland foods you can continue to advance your diet as you see fit.  If you develop a fever over 100.5, increased abdominal pain, bloody vomit, or bloody stool return for reevaluation or go to the ER.      ED Prescriptions     Medication Sig Dispense Auth. Provider   ondansetron (ZOFRAN-ODT) 4 MG disintegrating tablet Take 1 tablet (4 mg total) by mouth every 8 (eight) hours as needed for nausea or vomiting. 20 tablet Margarette Canada, NP   promethazine (PHENERGAN) 12.5 MG suppository Place 1 suppository (12.5 mg total) rectally every 6 (six) hours as needed for nausea or vomiting. 12 each Margarette Canada, NP      PDMP not reviewed this encounter.   Margarette Canada, NP 10/01/21 1054

## 2021-10-01 NOTE — Discharge Instructions (Signed)
Take the Zofran every 8 hours as needed for nausea and vomiting.  They are an oral disintegrating tablet and you can place them on her under your tongue and then will be absorbed. ? ?If the Zofran is not working for control of your nausea you can use the Phenergan suppositories.  You can take 1 every 6 hours.  These can be inserted either vaginally or rectally as a will be absorbed the same way. ? ?Follow a clear liquid diet for the next 6 to 12 hours.  Clear liquids consist of broth, ginger ale, water, Pedialyte, and Jell-O. ? ?After 6 to 12 hours, if you are tolerating clear liquids, you can advance to bland foods such as bananas, rice, applesauce, and toast.  If you tolerate bland foods you can continue to advance your diet as you see fit. ? ?If you develop a fever over 100.5, increased abdominal pain, bloody vomit, or bloody stool return for reevaluation or go to the ER.  ?

## 2021-10-01 NOTE — ED Triage Notes (Signed)
Pt c/o vomiting, diarrhea x1day.  ? ?Pt was around her foster kids who were sick with Norovirus.  ? ?Pt declined covid and flu testing.  ?

## 2021-10-09 DIAGNOSIS — G4733 Obstructive sleep apnea (adult) (pediatric): Secondary | ICD-10-CM | POA: Diagnosis not present

## 2021-10-15 ENCOUNTER — Other Ambulatory Visit (HOSPITAL_COMMUNITY): Payer: Self-pay

## 2021-10-17 ENCOUNTER — Other Ambulatory Visit (HOSPITAL_COMMUNITY): Payer: Self-pay

## 2021-10-21 ENCOUNTER — Other Ambulatory Visit (HOSPITAL_COMMUNITY): Payer: Self-pay

## 2021-11-14 ENCOUNTER — Other Ambulatory Visit (HOSPITAL_COMMUNITY): Payer: Self-pay

## 2021-11-21 ENCOUNTER — Other Ambulatory Visit (HOSPITAL_BASED_OUTPATIENT_CLINIC_OR_DEPARTMENT_OTHER): Payer: Self-pay

## 2021-11-21 DIAGNOSIS — G932 Benign intracranial hypertension: Secondary | ICD-10-CM | POA: Diagnosis not present

## 2021-11-21 DIAGNOSIS — K640 First degree hemorrhoids: Secondary | ICD-10-CM | POA: Diagnosis not present

## 2021-11-21 DIAGNOSIS — G4733 Obstructive sleep apnea (adult) (pediatric): Secondary | ICD-10-CM | POA: Diagnosis not present

## 2021-11-21 DIAGNOSIS — F411 Generalized anxiety disorder: Secondary | ICD-10-CM | POA: Diagnosis not present

## 2021-11-21 DIAGNOSIS — Z9989 Dependence on other enabling machines and devices: Secondary | ICD-10-CM | POA: Diagnosis not present

## 2021-11-21 DIAGNOSIS — Z6841 Body Mass Index (BMI) 40.0 and over, adult: Secondary | ICD-10-CM | POA: Diagnosis not present

## 2021-11-21 MED ORDER — SEMAGLUTIDE-WEIGHT MANAGEMENT 0.25 MG/0.5ML ~~LOC~~ SOAJ
SUBCUTANEOUS | 2 refills | Status: DC
  Filled 2021-11-21 – 2022-01-22 (×4): qty 2, 28d supply, fill #0

## 2021-11-21 MED ORDER — FLUOXETINE HCL 10 MG PO TABS
ORAL_TABLET | ORAL | 4 refills | Status: DC
  Filled 2021-11-21: qty 90, 60d supply, fill #0

## 2021-11-21 MED ORDER — HYDROCORTISONE ACETATE 25 MG RE SUPP
RECTAL | 3 refills | Status: AC
  Filled 2021-11-21 – 2021-11-27 (×2): qty 20, 10d supply, fill #0
  Filled 2022-08-14: qty 20, 10d supply, fill #1

## 2021-11-25 ENCOUNTER — Other Ambulatory Visit: Payer: Self-pay

## 2021-11-25 ENCOUNTER — Other Ambulatory Visit (HOSPITAL_BASED_OUTPATIENT_CLINIC_OR_DEPARTMENT_OTHER): Payer: Self-pay

## 2021-11-25 ENCOUNTER — Other Ambulatory Visit (HOSPITAL_COMMUNITY): Payer: Self-pay

## 2021-11-25 DIAGNOSIS — H43393 Other vitreous opacities, bilateral: Secondary | ICD-10-CM | POA: Diagnosis not present

## 2021-11-25 DIAGNOSIS — G932 Benign intracranial hypertension: Secondary | ICD-10-CM | POA: Diagnosis not present

## 2021-11-25 DIAGNOSIS — Z9689 Presence of other specified functional implants: Secondary | ICD-10-CM | POA: Diagnosis not present

## 2021-11-27 ENCOUNTER — Other Ambulatory Visit: Payer: Self-pay

## 2021-11-27 ENCOUNTER — Other Ambulatory Visit (HOSPITAL_COMMUNITY): Payer: Self-pay

## 2021-11-27 MED ORDER — LEVALBUTEROL TARTRATE 45 MCG/ACT IN AERO
INHALATION_SPRAY | RESPIRATORY_TRACT | 1 refills | Status: DC
  Filled 2021-11-27: qty 15, 25d supply, fill #0
  Filled 2022-08-14: qty 15, 25d supply, fill #1

## 2021-12-02 DIAGNOSIS — G43719 Chronic migraine without aura, intractable, without status migrainosus: Secondary | ICD-10-CM | POA: Diagnosis not present

## 2021-12-10 ENCOUNTER — Other Ambulatory Visit (HOSPITAL_COMMUNITY): Payer: Self-pay

## 2021-12-19 DIAGNOSIS — Z0000 Encounter for general adult medical examination without abnormal findings: Secondary | ICD-10-CM | POA: Diagnosis not present

## 2021-12-19 DIAGNOSIS — Z6841 Body Mass Index (BMI) 40.0 and over, adult: Secondary | ICD-10-CM | POA: Diagnosis not present

## 2021-12-19 DIAGNOSIS — F411 Generalized anxiety disorder: Secondary | ICD-10-CM | POA: Diagnosis not present

## 2021-12-24 ENCOUNTER — Ambulatory Visit: Payer: Self-pay

## 2021-12-24 DIAGNOSIS — S90112A Contusion of left great toe without damage to nail, initial encounter: Secondary | ICD-10-CM | POA: Diagnosis not present

## 2021-12-25 ENCOUNTER — Other Ambulatory Visit (HOSPITAL_BASED_OUTPATIENT_CLINIC_OR_DEPARTMENT_OTHER): Payer: Self-pay

## 2021-12-31 ENCOUNTER — Other Ambulatory Visit: Payer: Self-pay

## 2022-01-20 ENCOUNTER — Other Ambulatory Visit: Payer: Self-pay

## 2022-01-21 ENCOUNTER — Other Ambulatory Visit: Payer: Self-pay

## 2022-01-22 ENCOUNTER — Other Ambulatory Visit: Payer: Self-pay

## 2022-01-22 DIAGNOSIS — K640 First degree hemorrhoids: Secondary | ICD-10-CM | POA: Diagnosis not present

## 2022-01-30 DIAGNOSIS — G4733 Obstructive sleep apnea (adult) (pediatric): Secondary | ICD-10-CM | POA: Diagnosis not present

## 2022-02-02 ENCOUNTER — Encounter: Payer: Self-pay | Admitting: Emergency Medicine

## 2022-02-02 ENCOUNTER — Ambulatory Visit
Admission: EM | Admit: 2022-02-02 | Discharge: 2022-02-02 | Disposition: A | Discharge status: Home / Self Care | Payer: 59 | Attending: Physician Assistant | Admitting: Physician Assistant

## 2022-02-02 DIAGNOSIS — Z20822 Contact with and (suspected) exposure to covid-19: Secondary | ICD-10-CM | POA: Insufficient documentation

## 2022-02-02 LAB — SARS CORONAVIRUS 2 BY RT PCR: SARS Coronavirus 2 by RT PCR: POSITIVE — AB

## 2022-02-02 MED ORDER — BENZONATATE 100 MG PO CAPS: 100.0000 mg | ORAL_CAPSULE | Freq: Three times a day (TID) | ORAL | 0 refills | Status: DC

## 2022-02-02 NOTE — Discharge Instructions (Signed)
You were seen for symptoms after COVID exposure and are being treated for the same.   -Continue symptomatic treatment -Follow-up if symptoms worsen or do not resolve within 7 to 10 days.  Take care, Dr. Marland Kitchen, NP-c

## 2022-02-02 NOTE — ED Provider Notes (Signed)
[facility] - [place], [place]   Name: Carol Porter DOB: July 04, 1991 MRN: [id number] CSN: [id number] PCP: Wardell Honour, MD  Arrival date and time:  02/02/22 1114  Chief Complaint:  Covid Exposure and Cough   NOTE: Prior to seeing the patient today, I have reviewed the triage nursing documentation and vital signs. Clinical staff has updated patient's PMH/PSHx, current medication list, and drug allergies/intolerances to ensure comprehensive history available to assist in medical decision making.   History:   HPI: Carol Porter is a 31 y.o. female who presents today with complaints of congestion, cough and throat pain with known COVID exposure.  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  Symptoms started less than 24 hours ago.  Her job is requiring a COVID PCR prior to returning to work.  She denies fever, chills, body aches.  She is not requesting a Paxlovid prescription if her result returns as positive.   Past Medical History:  Diagnosis Date   Allergy    Anxiety    Asthma    Depression    Idiopathic intracranial hypertension     Past Surgical History:  Procedure Laterality Date   LUMBAR PUNCTURE      History reviewed. No pertinent family history.  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) 04/12/2020   OSA on CPAP 11/17/2018   Positive ANA (antinuclear antibody) 08/11/2018   PCOS (polycystic ovarian syndrome) 06/14/2018   Inappropriate sinus  tachycardia 11/04/2017   Mild intermittent asthma without complication 82/99/3716   ADHD, predominantly inattentive type 10/22/2015   Other specified eating disorder 08/31/2015   Anxiety disorder, unspecified 08/03/2015   Moderate episode of recurrent major depressive disorder ([place]) 08/03/2015   Unspecified hemorrhoids 08/03/2015   Classical migraine with intractable migraine 06/30/2014   Epigastric pain 05/12/2014   History of irritable bowel syndrome 05/12/2014    Home Medications:    Current Meds  Medication Sig   benzonatate (TESSALON) 100 MG capsule Take 1 capsule (100 mg total) by mouth every 8 (eight) hours.    Allergies:   Omeprazole, Flagyl [metronidazole], Oxycodone-acetaminophen, Phentermine, and Sulfamethoxazole-trimethoprim  Review of Systems (ROS): Review of Systems  Constitutional:  Negative for activity change, appetite change, chills, fatigue and fever.  HENT:  Positive for congestion, ear pain and sore throat. Negative for ear discharge, postnasal drip, sinus pain, sneezing and tinnitus.   Respiratory:  Positive for cough. Negative for wheezing.   Gastrointestinal:  Negative for nausea.  Musculoskeletal:  Negative for myalgias.  Neurological:  Positive for weakness. Negative for dizziness.     Vital Signs: Today's Vitals   02/02/22 1123 02/02/22 1126 02/02/22 1149 02/02/22 1203  BP:  112/63    Pulse:  92    Resp:  14    Temp:  98.1 F (36.7 C)    TempSrc:  Oral    SpO2:  98%    Weight: 285 lb 0.9 oz (129.3 kg)  Height: '5\' 9"'$  (1.753 m)     PainSc: 0-No pain  0-No pain 0-No pain    Physical Exam: Physical Exam Vitals and nursing note reviewed.  Constitutional:      Appearance: Normal appearance.  HENT:     Right Ear: Tympanic membrane normal.     Left Ear: Tympanic membrane normal.     Nose:     Right Sinus: No maxillary sinus tenderness or frontal sinus tenderness.     Left Sinus: No maxillary sinus tenderness or frontal sinus tenderness.   Cardiovascular:     Rate and Rhythm: Normal rate and regular rhythm.     Pulses: Normal pulses.     Heart sounds: Normal heart sounds.  Pulmonary:     Breath sounds: Normal breath sounds.  Abdominal:     General: Abdomen is flat.  Skin:    General: Skin is warm and dry.  Neurological:     Mental Status: She is alert.  Psychiatric:        Mood and Affect: Mood normal.        Behavior: Behavior normal.        Thought Content: Thought content normal.      Urgent Care Treatments / Results:   LABS: PLEASE NOTE: all labs that were ordered this encounter are listed, however only abnormal results are displayed. Labs Reviewed  SARS CORONAVIRUS 2 BY RT PCR - Abnormal; Notable for the following components:      Result Value   SARS Coronavirus 2 by RT PCR POSITIVE (*)    All other components within normal limits    EKG: -None  RADIOLOGY: No results found.  PROCEDURES: Procedures  MEDICATIONS RECEIVED THIS VISIT: Medications - No data to display  PERTINENT CLINICAL COURSE NOTES/UPDATES:   Initial Impression / Assessment and Plan / Urgent Care Course:  Pertinent labs & imaging results that were available during my care of the patient were personally reviewed by me and considered in my medical decision making (see lab/imaging section of note for values and interpretations).  Carol Porter is a 31 y.o. female who presents to [facility] today with complaints of URI symptoms with known COVID exposure, diagnosed with the same, and treated as such with the directions below. NP and patient reviewed discharge instructions below during visit.  Patient to receive results via [place].  Patient is well appearing overall in clinic today. She does not appear to be in any acute distress. Presenting symptoms (see HPI) and exam as documented above.   I have reviewed the follow up and strict return precautions for any new or worsening symptoms. Patient is aware of symptoms that would  be deemed urgent/emergent, and would thus require further evaluation either here or in the emergency department. At the time of discharge, she verbalized understanding and consent with the discharge plan as it was reviewed with her. All questions were fielded by provider and/or clinic staff prior to patient discharge.    Final Clinical Impressions / Urgent Care Diagnoses:   Final diagnoses:  Exposure to confirmed case of COVID-19    New Prescriptions:  [place] Controlled Substance Registry consulted? Not Applicable  Meds ordered this encounter  Medications   benzonatate (TESSALON) 100 MG capsule    Sig: Take 1 capsule (100 mg total) by mouth every 8 (eight) hours.    Dispense:  21 capsule    Refill:  0      Discharge Instructions      You were seen for symptoms  after COVID exposure and are being treated for the same.   -Continue symptomatic treatment -Follow-up if symptoms worsen or do not resolve within 7 to 10 days.  Take care, Dr. Marland Kitchen, NP-c     Recommended Follow up Care:  Patient encouraged to follow up with the following provider within the specified time frame, or sooner as dictated by the severity of her symptoms. As always, she was instructed that for any urgent/emergent care needs, she should seek care either here or in the emergency department for more immediate evaluation.   Gertie Baron, DNP, NP-c   Gertie Baron, NP 02/02/22 1235

## 2022-02-02 NOTE — ED Triage Notes (Signed)
Patient c/o cough, congestion, and fatigue that started yesterday.  Patient states that she had COVID exposure at work on [employer].  Patient denies fevers.  Patient needs a PCR COVID test.

## 2022-02-14 ENCOUNTER — Other Ambulatory Visit: Payer: Self-pay

## 2022-02-14 DIAGNOSIS — Z01419 Encounter for gynecological examination (general) (routine) without abnormal findings: Secondary | ICD-10-CM | POA: Diagnosis not present

## 2022-02-14 DIAGNOSIS — Z124 Encounter for screening for malignant neoplasm of cervix: Secondary | ICD-10-CM | POA: Diagnosis not present

## 2022-02-14 DIAGNOSIS — N930 Postcoital and contact bleeding: Secondary | ICD-10-CM | POA: Diagnosis not present

## 2022-02-14 MED ORDER — DOXYCYCLINE HYCLATE 100 MG PO TABS
ORAL_TABLET | ORAL | 0 refills | Status: DC
  Filled 2022-02-14: qty 20, 10d supply, fill #0

## 2022-02-17 ENCOUNTER — Other Ambulatory Visit: Payer: Self-pay

## 2022-02-18 ENCOUNTER — Other Ambulatory Visit (HOSPITAL_COMMUNITY): Payer: Self-pay

## 2022-02-18 ENCOUNTER — Other Ambulatory Visit: Payer: Self-pay

## 2022-02-18 MED ORDER — WEGOVY 0.25 MG/0.5ML ~~LOC~~ SOAJ
SUBCUTANEOUS | 2 refills | Status: DC
Start: 2022-02-18 — End: 2023-05-18
  Filled 2022-02-18 – 2022-02-28 (×2): qty 4, 28d supply, fill #0

## 2022-02-20 ENCOUNTER — Other Ambulatory Visit (HOSPITAL_COMMUNITY): Payer: Self-pay

## 2022-02-24 ENCOUNTER — Other Ambulatory Visit (HOSPITAL_COMMUNITY): Payer: Self-pay

## 2022-02-24 ENCOUNTER — Other Ambulatory Visit: Payer: Self-pay

## 2022-02-28 ENCOUNTER — Other Ambulatory Visit: Payer: Self-pay

## 2022-02-28 DIAGNOSIS — G932 Benign intracranial hypertension: Secondary | ICD-10-CM | POA: Diagnosis not present

## 2022-02-28 DIAGNOSIS — F411 Generalized anxiety disorder: Secondary | ICD-10-CM | POA: Diagnosis not present

## 2022-02-28 DIAGNOSIS — Z1331 Encounter for screening for depression: Secondary | ICD-10-CM | POA: Diagnosis not present

## 2022-02-28 DIAGNOSIS — Z6841 Body Mass Index (BMI) 40.0 and over, adult: Secondary | ICD-10-CM | POA: Diagnosis not present

## 2022-02-28 DIAGNOSIS — Z9989 Dependence on other enabling machines and devices: Secondary | ICD-10-CM | POA: Diagnosis not present

## 2022-02-28 DIAGNOSIS — G4733 Obstructive sleep apnea (adult) (pediatric): Secondary | ICD-10-CM | POA: Diagnosis not present

## 2022-02-28 DIAGNOSIS — E282 Polycystic ovarian syndrome: Secondary | ICD-10-CM | POA: Diagnosis not present

## 2022-02-28 MED ORDER — WEGOVY 0.5 MG/0.5ML ~~LOC~~ SOAJ
SUBCUTANEOUS | 5 refills | Status: DC
  Filled 2022-02-28: qty 2, 28d supply, fill #0

## 2022-02-28 MED ORDER — FLUOXETINE HCL 20 MG PO TABS
ORAL_TABLET | ORAL | 3 refills | Status: DC
Start: 2022-02-28 — End: 2023-02-27
  Filled 2022-02-28: qty 90, 90d supply, fill #0
  Filled 2022-05-15: qty 90, 90d supply, fill #1
  Filled 2022-10-06: qty 90, 90d supply, fill #2
  Filled 2022-12-05 – 2022-12-19 (×2): qty 90, 90d supply, fill #3
  Filled 2023-01-02: qty 90, 90d supply, fill #0

## 2022-03-03 ENCOUNTER — Other Ambulatory Visit: Payer: Self-pay

## 2022-03-04 ENCOUNTER — Other Ambulatory Visit: Payer: Self-pay

## 2022-03-05 ENCOUNTER — Other Ambulatory Visit: Payer: Self-pay

## 2022-03-11 ENCOUNTER — Encounter: Payer: Self-pay | Admitting: Emergency Medicine

## 2022-03-11 ENCOUNTER — Emergency Department
Admission: EM | Admit: 2022-03-11 | Discharge: 2022-03-11 | Disposition: A | Discharge status: Home / Self Care | Payer: 59 | Attending: Emergency Medicine | Admitting: Emergency Medicine

## 2022-03-11 ENCOUNTER — Ambulatory Visit (INDEPENDENT_AMBULATORY_CARE_PROVIDER_SITE_OTHER): Payer: 59 | Admitting: Podiatry

## 2022-03-11 ENCOUNTER — Other Ambulatory Visit: Payer: Self-pay

## 2022-03-11 DIAGNOSIS — R8289 Other abnormal findings on cytological and histological examination of urine: Secondary | ICD-10-CM | POA: Insufficient documentation

## 2022-03-11 DIAGNOSIS — M722 Plantar fascial fibromatosis: Secondary | ICD-10-CM | POA: Diagnosis not present

## 2022-03-11 DIAGNOSIS — F419 Anxiety disorder, unspecified: Secondary | ICD-10-CM | POA: Diagnosis not present

## 2022-03-11 DIAGNOSIS — R110 Nausea: Secondary | ICD-10-CM | POA: Diagnosis not present

## 2022-03-11 DIAGNOSIS — G43719 Chronic migraine without aura, intractable, without status migrainosus: Secondary | ICD-10-CM | POA: Diagnosis not present

## 2022-03-11 DIAGNOSIS — R55 Syncope and collapse: Secondary | ICD-10-CM | POA: Insufficient documentation

## 2022-03-11 LAB — BASIC METABOLIC PANEL
Anion gap: 9 (ref 5–15)
BUN: 13 mg/dL (ref 6–20)
CO2: 23 mmol/L (ref 22–32)
Calcium: 9 mg/dL (ref 8.9–10.3)
Chloride: 104 mmol/L (ref 98–111)
Creatinine, Ser: 0.67 mg/dL (ref 0.44–1.00)
GFR, Estimated: >60 mL/min (ref >60)
Glucose, Bld: 122 mg/dL — ABNORMAL HIGH (ref 70–99)
Potassium: 3.6 mmol/L (ref 3.5–5.1)
Sodium: 136 mmol/L (ref 135–145)

## 2022-03-11 LAB — POC URINE PREG, ED: Preg Test, Ur: NEGATIVE

## 2022-03-11 LAB — URINALYSIS, ROUTINE W REFLEX MICROSCOPIC
Bilirubin Urine: NEGATIVE
Glucose, UA: NEGATIVE mg/dL
Hgb urine dipstick: NEGATIVE
Ketones, ur: NEGATIVE mg/dL
Nitrite: NEGATIVE
Protein, ur: NEGATIVE mg/dL
Specific Gravity, Urine: 1.011 (ref 1.005–1.030)
pH: 8 (ref 5.0–8.0)

## 2022-03-11 LAB — CBC
HCT: 38.2 % (ref 36.0–46.0)
Hemoglobin: 12 g/dL (ref 12.0–15.0)
MCH: 27.2 pg (ref 26.0–34.0)
MCHC: 31.4 g/dL (ref 30.0–36.0)
MCV: 86.6 fL (ref 80.0–100.0)
Platelets: 305 10*3/uL (ref 150–400)
RBC: 4.41 MIL/uL (ref 3.87–5.11)
RDW: 13.5 % (ref 11.5–15.5)
WBC: 9.9 10*3/uL (ref 4.0–10.5)
nRBC: 0 % (ref 0.0–0.2)

## 2022-03-11 LAB — TROPONIN I (HIGH SENSITIVITY)
Troponin I (High Sensitivity): <2 ng/L (ref <18)
Troponin I (High Sensitivity): <2 ng/L (ref <18)

## 2022-03-11 NOTE — Discharge Instructions (Addendum)
I suspect this is a combination of not eating and drinking associated with the Botox but you can call your cardiologist to make a follow-up appointment and return to the ER if you develop headaches, blurred vision weakness on one side of your body or any other concerns

## 2022-03-11 NOTE — Progress Notes (Signed)
   Chief Complaint  Patient presents with   Foot Pain    R HEEL PAIN    Subjective: 31 y.o. female    Past Medical History:  Diagnosis Date   Allergy    Anxiety    Asthma    Depression    Idiopathic intracranial hypertension      Objective: Physical Exam General: The patient is alert and oriented x3 in no acute distress.  Dermatology: Skin is warm, dry and supple bilateral lower extremities. Negative for open lesions or macerations bilateral.   Vascular: Dorsalis Pedis and Posterior Tibial pulses palpable bilateral.  Capillary fill time is immediate to all digits.  Neurological: Epicritic and protective threshold intact bilateral.   Musculoskeletal: Tenderness to palpation to the plantar aspect of the right heel along the plantar fascia. All other joints range of motion within normal limits bilateral. Strength 5/5 in all groups bilateral.   Radiographic exam: Normal osseous mineralization. Joint spaces preserved. No fracture/dislocation/boney destruction. No other soft tissue abnormalities or radiopaque foreign bodies.   Assessment: 1. Plantar fasciitis right  Plan of Care:  1. Patient evaluated. Xrays reviewed.   2.  Patient states that over the past few weeks the pain has improved significantly. 3.  Patient declined cortisone injection today.  She had Botox injections performed today and experienced presyncopal episodes immediately after. 4.  No NSAIDs prescribed.  GI sensitivity 5.  Recommend good supportive shoes and sneakers.  Advised against going barefoot 6.  Patient is leaving on a trip to [place] in September.  Return to clinic just before trip for reevaluation and possible cortisone injection and prednisone pack  *PA at the [facility]   Edrick Kins, DPM [facility]  Dr. Edrick Kins, DPM    [address], [place] [address]                Office [phone]  Fax [phone]

## 2022-03-11 NOTE — ED Triage Notes (Signed)
Patient to ED for near syncope. Patient received Botox this AM and after procedure suddenly got dizzy and lightheaded. Denies LOC. Patient now feeling tired and weak.

## 2022-03-11 NOTE — ED Provider Notes (Signed)
[facility] Provider Note    Event Date/Time   First MD Initiated Contact with Patient 03/11/22 1135     (approximate)   History   Near Syncope   HPI  Carol Porter is a 31 y.o. female who comes in with syncopal episode after receiving Botox this morning.  Patient works at the [employer] center here and she reports that she not eaten or drink anything and that she was trying to go down to the cafeteria to try to get something to eat for lunch and she started to have these waves of dizziness and nausea.  She stated that she went back to her office and they kept having these waves that lasted for about 10 minutes and then would go away and come back.  She denies any full syncopal episode.  Denies any falling down hitting her head.  On review of neurosurgery notes she does have a stent placed due to idiopathic intracranial hypertension and followed by neurosurgery.  I reviewed their notes on 06/07/2021.  However she denies any new headaches or vision changes to suggest issues with her stents.  She reports that she feels at her baseline self now.  She does report a lot of anxiety and she is not sure if that could be contributing as well.     Physical Exam   Triage Vital Signs: ED Triage Vitals [03/11/22 1018]  Enc Vitals Group     BP 120/60     Pulse Rate 93     Resp 18     Temp 98 F (36.7 C)     Temp Source Oral     SpO2 100 %     Weight 281 lb (127.5 kg)     Height '5\' 9"'$  (1.753 m)     Head Circumference      Peak Flow      Pain Score 0     Pain Loc      Pain Edu?      Excl. in [place]?     Most recent vital signs: Vitals:   03/11/22 1018  BP: 120/60  Pulse: 93  Resp: 18  Temp: 98 F (36.7 C)  SpO2: 100%     General: Awake, no distress.  CV:  Good peripheral perfusion.  Resp:  Normal effort.  Abd:  No distention.  No abdominal tenderness Other:  Cranial nerves II through XII are intact.  Equal strength in arms and legs.   ED Results /  Procedures / Treatments   Labs (all labs ordered are listed, but only abnormal results are displayed) Labs Reviewed  BASIC METABOLIC PANEL - Abnormal; Notable for the following components:      Result Value   Glucose, Bld 122 (*)    All other components within normal limits  URINALYSIS, ROUTINE W REFLEX MICROSCOPIC - Abnormal; Notable for the following components:   Color, Urine YELLOW (*)    APPearance CLEAR (*)    Leukocytes,Ua SMALL (*)    Bacteria, UA MANY (*)    All other components within normal limits  CBC  POC URINE PREG, ED  CBG MONITORING, ED     EKG  My interpretation of EKG:  Normal sinus rate of 88 without any obvious ST elevation but she does have some T wave inversion in lead V3 and lead III with a incomplete right bundle branch block.  Reviewed her prior EKGs and she has had some similar T wave inversions  PROCEDURES:  Critical Care performed:  No  Procedures   MEDICATIONS ORDERED IN ED: Medications - No data to display   IMPRESSION / MDM / [place] / ED COURSE  I reviewed the triage vital signs and the nursing notes.   Patient's presentation is most consistent with acute presentation with potential threat to life or bodily function.   Differentials Electra abnormalities, AKI, vasovagal, dehydration.  She denies any headaches or symptoms of issues with her stent in her brain.  Given she is feeling closer to her baseline self we have elected to hold off on any CT imaging at this time.  Her EKG does have some T wave inversions but I do not see any evidence of any other arrhythmia.  We will get some troponins just to ensure no evidence of ACS but have lower suspicion. No delta wave, prolonged intervals, burgada, other cocnerns.   Pregnancy test is negative.  BMP is normal CBC is normal urine with many bacteria but patient denies any urinary symptoms.  Pregnancy test is negative.  1:25 PM patient's troponins are negative x2.  Work-up is  otherwise reassuring.  Reports feeling near her baseline self just a little bit tired.  Considered admission for syncopal events but given reassuring EKG troponins patient feels comfortable with discharge home and she has a cardiologist that she can follow-up with.       FINAL CLINICAL IMPRESSION(S) / ED DIAGNOSES   Final diagnoses:  Near syncope     Rx / DC Orders   ED Discharge Orders     None        Note:  This document was prepared using Dragon voice recognition software and may include unintentional dictation errors.   Vanessa Grimsley, MD 03/11/22 1326

## 2022-03-11 NOTE — ED Notes (Signed)
Pt states she received botox in her shoulders neck and head for migraines. Pt shortly after began having episodes of near syncope about 5 times.

## 2022-03-24 ENCOUNTER — Ambulatory Visit
Admission: EM | Admit: 2022-03-24 | Discharge: 2022-03-24 | Disposition: A | Discharge status: Home / Self Care | Payer: 59 | Attending: Physician Assistant | Admitting: Physician Assistant

## 2022-03-24 ENCOUNTER — Other Ambulatory Visit: Payer: Self-pay

## 2022-03-24 DIAGNOSIS — R238 Other skin changes: Secondary | ICD-10-CM | POA: Diagnosis not present

## 2022-03-24 MED ORDER — VALACYCLOVIR HCL 1 G PO TABS
1000.0000 mg | ORAL_TABLET | Freq: Three times a day (TID) | ORAL | 0 refills | Status: AC
Start: 2022-03-24 — End: 2022-04-01
  Filled 2022-03-24: qty 21, 7d supply, fill #0

## 2022-03-24 NOTE — ED Triage Notes (Signed)
Pt c/o Rash along right upper leg x1day  Pt Denies any poison oak and ivy or changed any laundry products.  Pt denies any bug bites and states that it has a burning/ stinging sensation.   Pt believes that she has shingles.

## 2022-03-24 NOTE — Discharge Instructions (Addendum)
-  Awaiting testing.  Should be back in the next couple days.  We will call you with results. - Treating at this time for possible shingles.  Sent Valtrex to pharmacy. - Follow-up with [place] as needed.

## 2022-03-24 NOTE — ED Provider Notes (Signed)
[facility]    CSN: [id number] Arrival date & time: 03/24/22  1042      History   Chief Complaint Chief Complaint  Patient presents with   Rash    HPI Carol Porter is a 31 y.o. female presenting for mildly painful vesicular rash of the right groin.  Patient is unsure as to what could have caused this.  She denies any contact with any poisonous plants and denies any changes with her body washes or detergents.  Denies similar history.  Patient says rash started yesterday.  She says it has spread a little bit today.  She reports she believes it could be shingles.  Has never had shingles before.  She also reports concerns for HSV but says she has only been sexually active with her husband for the past several years.  She did have sexual partners before her husband and states a couple of them were nonmonogamous but she never experienced any symptoms or new them to experience any symptoms previously.  No other complaints.  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) 04/12/2020   OSA on CPAP 11/17/2018   Positive ANA (antinuclear antibody) 08/11/2018   PCOS (polycystic ovarian syndrome) 06/14/2018   Inappropriate sinus tachycardia 11/04/2017   Mild intermittent asthma without complication 41/66/0630   ADHD, predominantly inattentive type 10/22/2015   Other specified eating disorder 08/31/2015   Anxiety disorder, unspecified 08/03/2015   Moderate episode of recurrent major depressive disorder ([facility]) 08/03/2015   Unspecified hemorrhoids 08/03/2015   Classical migraine with intractable migraine 06/30/2014    Epigastric pain 05/12/2014   History of irritable bowel syndrome 05/12/2014    Past Surgical History:  Procedure Laterality Date   LUMBAR PUNCTURE      OB History   No obstetric history on file.      Home Medications    Prior to Admission medications   Medication Sig Start Date End Date Taking? Authorizing Provider  Albuterol Sulfate (PROAIR RESPICLICK) 160 (90 Base) MCG/ACT AEPB Take by mouth. 06/14/15  Yes [provider]  aspirin 81 MG EC tablet Take by mouth.   Yes [provider]  botulinum toxin Type A (BOTOX) 200 units injection INJECT 200 UNITS INTO THE MUSCLES OF MULTIPLE SITES OF THE FACE AND NECK BY PHYSICIAN ONCE EVERY 3 MONTHS. 08/02/21  Yes   Botulinum Toxin Type A (BOTOX) 200 units SOLR INJECT 200 UNITS INTO THE MUSCLES OF MULTIPLE SITES OF THE FACE AND NECK BY PHYSICIAN ONCE EVERY 3 MONTHS. 01/16/21  Yes   EPINEPHrine (EPIPEN 2-PAK) 0.3 mg/0.3 mL IJ SOAJ injection Inject intramuscularly in the event of an emergent allergic reaction with difficulty breathing. 04/15/21  Yes   FLUoxetine (PROZAC) 20 MG tablet Take 1 tablet by mouth once daily 02/28/22  Yes   furosemide (LASIX) 20 MG tablet Take 1 tablet (20 mg total) by mouth 2 (two)  times daily as needed. 06/07/21  Yes   hydrocortisone (ANUSOL-HC) 25 MG suppository Place rectally. 05/02/20  Yes [provider]  hydrocortisone (ANUSOL-HC) 25 MG suppository Place 1 suppository (25 mg total) rectally 2 (two) times daily as  needed for Hemorrhoids 11/21/21  Yes   ipratropium (ATROVENT) 0.06 % nasal spray Place 2 sprays into both nostrils 4 (four) times daily. 09/22/21  Yes Margarette Canada, NP  levalbuterol [facility] HFA) 45 MCG/ACT inhaler Inhale 2 inhalations into the lungs every 6 (six) hours as needed for Wheezing 11/27/21  Yes   Magnesium Bisglycinate (MAG GLYCINATE) 100 MG TABS Take by mouth.   Yes [provider]  ondansetron (ZOFRAN ODT) 8 MG disintegrating tablet Take 1 tablet (8 mg total) by  mouth every 8 (eight) hours as needed for nausea or vomiting. 06/17/21  Yes Rodriguez-Southworth, Sunday Spillers, PA-C  promethazine (PHENERGAN) 12.5 MG suppository Place 1 suppository (12.5 mg total) rectally every 6 (six) hours as needed for nausea or vomiting. 10/01/21  Yes Margarette Canada, NP  ranitidine (ZANTAC) 75 MG tablet Take 75 mg by mouth as needed.    Yes [provider]  Semaglutide-Weight Management (WEGOVY) 0.25 MG/0.5ML SOAJ Inject 1 mL (0.5 mg total) subcutaneously once a week 02/18/22  Yes   Semaglutide-Weight Management (WEGOVY) 0.5 MG/0.5ML SOAJ Inject 0.5 mg under the skin once weekly 02/28/22  Yes   Semaglutide-Weight Management 0.25 MG/0.5ML SOAJ Inject 0.25 mg into the skin once weekly. 11/21/21  Yes   triamcinolone (NASACORT) 55 MCG/ACT AERO nasal inhaler Use 2 sprays in each nostril once daily 04/15/21  Yes   valACYclovir (VALTREX) 1000 MG tablet Take 1 tablet (1,000 mg total) by mouth 3 (three) times daily for 7 days. 03/24/22 03/31/22 Yes Danton Clap, PA-C  FLUoxetine (PROZAC) 10 MG tablet Take 1 and 1/2 tablets by mouth once daily 03/22/21     FLUoxetine (PROZAC) 10 MG tablet Take 1 and 1/2 tablets by mouth once daily. 11/21/21     ondansetron (ZOFRAN-ODT) 4 MG disintegrating tablet Take 1 tablet (4 mg total) by mouth every 8 (eight) hours as needed for nausea or vomiting. 10/01/21   Margarette Canada, NP  sucralfate (CARAFATE) 1 g tablet Take 1 tablet by mouth 4 (four) times daily. 09/30/17 11/07/20  [provider]  SYEDA 3-0.03 MG tablet Take 1 tablet by mouth daily. 09/30/17 11/07/20  [provider]    Family History No family history on file.  Social History Social History   Tobacco Use   Smoking status: Never   Smokeless tobacco: Never  Vaping Use   Vaping Use: Never used  Substance Use Topics   Alcohol use: Yes    Comment: social   Drug use: No     Allergies   Omeprazole, Flagyl [metronidazole], Oxycodone-acetaminophen, Phentermine, and  Sulfamethoxazole-trimethoprim   Review of Systems Review of Systems  Constitutional:  Negative for fever.  Genitourinary:  Negative for dysuria, pelvic pain and vaginal discharge.  Musculoskeletal:  Negative for arthralgias and myalgias.  Skin:  Positive for rash.     Physical Exam Triage Vital Signs ED Triage Vitals  Enc Vitals Group     BP      Pulse      Resp      Temp      Temp src      SpO2      Weight      Height      Head Circumference      Peak Flow      Pain Score      Pain Loc      Pain Edu?      Excl. in [place]?    No data found.  Updated Vital Signs BP 115/67 (BP Location: Left  Arm)   Pulse 71   Temp 98.4 F (36.9 C) (Oral)   Resp 18   Ht '5\' 9"'$  (1.753 m)   Wt 275 lb (124.7 kg)   LMP 03/03/2022   SpO2 100%   BMI 40.61 kg/m     Physical Exam Vitals and nursing note reviewed.  Constitutional:      General: She is not in acute distress.    Appearance: Normal appearance. She is not ill-appearing or toxic-appearing.  HENT:     Head: Normocephalic and atraumatic.  Eyes:     General: No scleral icterus.       Right eye: No discharge.        Left eye: No discharge.     Conjunctiva/sclera: Conjunctivae normal.  Cardiovascular:     Rate and Rhythm: Normal rate and regular rhythm.     Heart sounds: Normal heart sounds.  Pulmonary:     Effort: Pulmonary effort is normal. No respiratory distress.     Breath sounds: Normal breath sounds.  Musculoskeletal:     Cervical back: Neck supple.  Skin:    General: Skin is dry.     Findings: Rash present.     Comments: Small area of vesicles of the right groin adjacent to the right side of vulva.  Vesicles with clear fluid on erythematous base.  Neurological:     General: No focal deficit present.     Mental Status: She is alert. Mental status is at baseline.     Motor: No weakness.     Gait: Gait normal.  Psychiatric:        Mood and Affect: Mood normal.        Behavior: Behavior normal.        Thought  Content: Thought content normal.      UC Treatments / Results  Labs (all labs ordered are listed, but only abnormal results are displayed) Labs Reviewed  HSV CULTURE AND TYPING  VARICELLA-ZOSTER BY PCR    EKG   Radiology No results found.  Procedures Procedures (including critical care time)  Medications Ordered in UC Medications - No data to display  Initial Impression / Assessment and Plan / UC Course  I have reviewed the triage vital signs and the nursing notes.  Pertinent labs & imaging results that were available during my care of the patient were reviewed by me and considered in my medical decision making (see chart for details).   31 year old female presenting for mildly tender vesicular rash of the right groin for the past day.  Denies any changes to her routine or potential allergens that could have caused this.  No reported history of shingles or HSV.  Would like to be tested for both shingles and HSV today.  On exam she does have an area of clear vesicles on erythematous base of the right groin.  Obtained 2 separate swabs to check for HSV and varicella.  Advised patient we will contact her with the results but given the appearance, I do have concern for possible shingles or HSV.  We will treat at this time with Valtrex so that condition does not worsen if it is due to one of these viruses.  Advise keeping the area covered.  No sexual contact until results return.  Advised her to follow-up with [place] as needed.   Final Clinical Impressions(s) / UC Diagnoses   Final diagnoses:  Vesicular rash     Discharge Instructions      -Awaiting testing.  Should be  back in the next couple days.  We will call you with results. - Treating at this time for possible shingles.  Sent Valtrex to pharmacy. - Follow-up with [place] as needed.     ED Prescriptions     Medication Sig Dispense Auth. Provider   valACYclovir (VALTREX) 1000 MG tablet Take 1 tablet (1,000 mg total) by mouth  3 (three) times daily for 7 days. 21 tablet Danton Clap, PA-C      PDMP not reviewed this encounter.   Danton Clap, PA-C 03/24/22 1133

## 2022-03-26 LAB — HSV CULTURE AND TYPING

## 2022-03-28 LAB — VARICELLA-ZOSTER BY PCR: Varicella-Zoster, PCR: POSITIVE — AB

## 2022-04-11 ENCOUNTER — Ambulatory Visit (INDEPENDENT_AMBULATORY_CARE_PROVIDER_SITE_OTHER): Payer: 59 | Admitting: Podiatry

## 2022-04-11 DIAGNOSIS — M722 Plantar fascial fibromatosis: Secondary | ICD-10-CM | POA: Diagnosis not present

## 2022-04-13 ENCOUNTER — Other Ambulatory Visit: Payer: Self-pay

## 2022-04-13 MED ORDER — METHYLPREDNISOLONE 4 MG PO TBPK
ORAL_TABLET | ORAL | 0 refills | Status: AC
  Filled 2022-04-13: qty 21, 6d supply, fill #0

## 2022-04-13 NOTE — Progress Notes (Signed)
   Chief Complaint  Patient presents with   right heel pain    Patient is here for right heel pain  follow-up.    Subjective: 31 y.o. female    Past Medical History:  Diagnosis Date   Allergy    Anxiety    Asthma    Depression    Idiopathic intracranial hypertension      Objective: Physical Exam General: The patient is alert and oriented x3 in no acute distress.  Dermatology: Skin is warm, dry and supple bilateral lower extremities. Negative for open lesions or macerations bilateral.   Vascular: Dorsalis Pedis and Posterior Tibial pulses palpable bilateral.  Capillary fill time is immediate to all digits.  Neurological: Epicritic and protective threshold intact bilateral.   Musculoskeletal: Tenderness to palpation to the plantar aspect of the right heel along the plantar fascia. All other joints range of motion within normal limits bilateral. Strength 5/5 in all groups bilateral.   Radiographic exam: Normal osseous mineralization. Joint spaces preserved. No fracture/dislocation/boney destruction. No other soft tissue abnormalities or radiopaque foreign bodies.   Assessment: 1. Plantar fasciitis right  Plan of Care:  1. Patient evaluated. Xrays reviewed.   2.  Patient declined cortisone injection again today.  She had Botox injections in the past which created multiple presyncopal episodes immediately after. 4.  No NSAIDs prescribed.  GI sensitivity 5.  Recommend good supportive shoes and sneakers.  Advised against going barefoot 6.  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  Leaving for [employer] in 2 weeks   Edrick Kins, DPM [facility]  Dr. Edrick Kins, DPM    [address], [place] [address]                Office [phone]  Fax [phone]

## 2022-04-14 ENCOUNTER — Other Ambulatory Visit: Payer: Self-pay

## 2022-04-17 ENCOUNTER — Ambulatory Visit: Payer: 59 | Admitting: Physical Therapy

## 2022-04-30 ENCOUNTER — Other Ambulatory Visit (HOSPITAL_COMMUNITY): Payer: Self-pay

## 2022-05-15 ENCOUNTER — Other Ambulatory Visit: Payer: Self-pay

## 2022-05-15 MED ORDER — ONDANSETRON 4 MG PO TBDP
4.0000 mg | ORAL_TABLET | Freq: Three times a day (TID) | ORAL | 0 refills | Status: DC | PRN
  Filled 2022-05-15: qty 20, 7d supply, fill #0

## 2022-05-16 ENCOUNTER — Other Ambulatory Visit: Payer: Self-pay

## 2022-05-23 DIAGNOSIS — I4711 Inappropriate sinus tachycardia, so stated: Secondary | ICD-10-CM | POA: Diagnosis not present

## 2022-05-23 DIAGNOSIS — G932 Benign intracranial hypertension: Secondary | ICD-10-CM | POA: Diagnosis not present

## 2022-05-23 DIAGNOSIS — R55 Syncope and collapse: Secondary | ICD-10-CM | POA: Diagnosis not present

## 2022-05-23 DIAGNOSIS — I4510 Unspecified right bundle-branch block: Secondary | ICD-10-CM | POA: Diagnosis not present

## 2022-05-23 DIAGNOSIS — G43719 Chronic migraine without aura, intractable, without status migrainosus: Secondary | ICD-10-CM | POA: Diagnosis not present

## 2022-05-29 DIAGNOSIS — G4733 Obstructive sleep apnea (adult) (pediatric): Secondary | ICD-10-CM | POA: Diagnosis not present

## 2022-06-16 ENCOUNTER — Other Ambulatory Visit: Payer: Self-pay | Admitting: Obstetrics and Gynecology

## 2022-06-16 DIAGNOSIS — N63 Unspecified lump in unspecified breast: Secondary | ICD-10-CM

## 2022-06-18 ENCOUNTER — Other Ambulatory Visit (HOSPITAL_COMMUNITY): Payer: Self-pay

## 2022-06-18 MED ORDER — BOTOX 200 UNITS IJ SOLR: INTRAMUSCULAR | 3 refills | Status: DC

## 2022-07-02 ENCOUNTER — Ambulatory Visit
Admission: RE | Admit: 2022-07-02 | Discharge: 2022-07-02 | Disposition: A | Discharge status: Home / Self Care | Payer: 59 | Source: Ambulatory Visit | Attending: Obstetrics and Gynecology | Admitting: Obstetrics and Gynecology

## 2022-07-02 DIAGNOSIS — N6489 Other specified disorders of breast: Secondary | ICD-10-CM | POA: Diagnosis not present

## 2022-07-02 DIAGNOSIS — R92323 Mammographic fibroglandular density, bilateral breasts: Secondary | ICD-10-CM | POA: Diagnosis not present

## 2022-07-02 DIAGNOSIS — N63 Unspecified lump in unspecified breast: Secondary | ICD-10-CM

## 2022-07-03 ENCOUNTER — Other Ambulatory Visit (HOSPITAL_BASED_OUTPATIENT_CLINIC_OR_DEPARTMENT_OTHER): Payer: Self-pay

## 2022-07-03 ENCOUNTER — Other Ambulatory Visit: Payer: Self-pay | Admitting: *Deleted

## 2022-07-03 DIAGNOSIS — J4520 Mild intermittent asthma, uncomplicated: Secondary | ICD-10-CM

## 2022-07-03 MED ORDER — AMOXICILLIN-POT CLAVULANATE 875-125 MG PO TABS
1.0000 | ORAL_TABLET | Freq: Two times a day (BID) | ORAL | 0 refills | Status: DC
  Filled 2022-07-03: qty 20, 10d supply, fill #0

## 2022-07-03 MED ORDER — METHYLPREDNISOLONE 4 MG PO TBPK
ORAL_TABLET | ORAL | 0 refills | Status: DC
  Filled 2022-07-03: qty 21, 6d supply, fill #0

## 2022-07-04 ENCOUNTER — Other Ambulatory Visit: Payer: 59

## 2022-07-10 ENCOUNTER — Institutional Professional Consult (permissible substitution): Payer: 59 | Admitting: Internal Medicine

## 2022-07-17 DIAGNOSIS — B9689 Other specified bacterial agents as the cause of diseases classified elsewhere: Secondary | ICD-10-CM | POA: Diagnosis not present

## 2022-07-17 DIAGNOSIS — R0989 Other specified symptoms and signs involving the circulatory and respiratory systems: Secondary | ICD-10-CM | POA: Diagnosis not present

## 2022-07-17 DIAGNOSIS — J019 Acute sinusitis, unspecified: Secondary | ICD-10-CM | POA: Diagnosis not present

## 2022-07-17 DIAGNOSIS — R059 Cough, unspecified: Secondary | ICD-10-CM | POA: Diagnosis not present

## 2022-07-17 DIAGNOSIS — R053 Chronic cough: Secondary | ICD-10-CM | POA: Diagnosis not present

## 2022-07-23 NOTE — Progress Notes (Signed)
ELECTROPHYSIOLOGY CONSULT NOTE  Patient ID: Carol Porter, MRN: [id number], DOB/AGE: 1991/02/22 31 y.o. Admit date: (Not on file) Date of Consult: 07/24/2022  Primary Physician: Carol Honour, MD Primary Cardiologist: Previously Carol Porter     Carol Porter is a 31 y.o. female who is being seen today for the evaluation of "right bundle branch block "at the request of Carol Porter.    HPI Carol Porter is a 31 y.o. 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  In this regard she does have orthostatic intolerance with exercise-induced tachypalpitations, shower intolerance, heat intolerance, menses intolerance.  Has a history of idiopathic intracranial hypertension for which she has had transverse sinus stenting previously treated with Diamox, and has some salt and fluid restrictions related to this.  Prior echo was unrevealing for the presence of an ASD ( neg bubble study)  4/19  Anxiety is an issue depression is better  Covid 7/23  Date Cr K Hgb  8/23 0.67 3.6 12.0            Past Medical History:  Diagnosis Date   Allergy    Anxiety    Asthma    Depression    Idiopathic intracranial hypertension       Surgical History:  Past Surgical History:  Procedure Laterality Date   LUMBAR PUNCTURE       Home Meds: Current Meds  Medication Sig   Albuterol Sulfate (PROAIR RESPICLICK) 350 (90 Base) MCG/ACT AEPB Take by mouth.   doxycycline (MONODOX) 100 MG capsule Take 1 capsule by mouth 2 (two) times daily.   FLUoxetine (PROZAC) 10 MG tablet Take 1 and 1/2 tablets by mouth once daily.   furosemide (LASIX) 20 MG tablet Take 1 tablet (20 mg total) by mouth 2 (two)  times daily as needed.   hydrocortisone (ANUSOL-HC) 25 MG suppository Place 1 suppository (25 mg total) rectally 2 (two) times daily as needed for Hemorrhoids    ipratropium (ATROVENT) 0.06 % nasal spray Place 2 sprays into both nostrils 4 (four) times daily.   levalbuterol (XOPENEX HFA) 45 MCG/ACT inhaler Inhale 2 inhalations into the lungs every 6 (six) hours as needed for Wheezing   Magnesium Bisglycinate (MAG GLYCINATE) 100 MG TABS Take by mouth daily.   Multiple Vitamins-Iron (MULTI-VITAMIN/IRON PO) Take by mouth daily.   Omega-3 1000 MG CAPS Take by mouth daily.   ondansetron (ZOFRAN-ODT) 4 MG disintegrating tablet Take 1 tablet (4 mg total) by mouth every 8 (eight) hours as needed for Nausea or Vomiting   promethazine (PHENERGAN) 12.5 MG suppository Place 1 suppository (12.5 mg total) rectally every 6 (six) hours as needed for nausea or vomiting.   ranitidine (ZANTAC) 75 MG tablet Take 75 mg by mouth as needed.     Allergies:  Allergies  Allergen Reactions   Omeprazole Hives, Itching, Rash and Other (See Comments)   Flagyl [Metronidazole] Other (See Comments)    Tachycardia , dizziness , nausea and vomiting   Oxycodone-Acetaminophen Nausea And Vomiting   Phentermine     Fast heart rate and light headed   Sulfamethoxazole-Trimethoprim Nausea And Vomiting and Nausea Only    Social History   Socioeconomic History   Marital status: Single    Spouse name: Not on file   Number of children: Not on file   Years of education: Not on file  Highest education level: Not on file  Occupational History   Not on file  Tobacco Use   Smoking status: Never   Smokeless tobacco: Never  Vaping Use   Vaping Use: Never used  Substance and Sexual Activity   Alcohol use: Yes    Comment: social   Drug use: No   Sexual activity: Yes  Other Topics Concern   Not on file  Social History Narrative   Not on file   Social Determinants of Health   Financial Resource Strain: Not on file  Food Insecurity: Not on file  Transportation Needs: Not on file  Physical Activity: Not on file  Stress: Not on file  Social Connections: Not on file  Intimate  Partner Violence: Not on file     History reviewed. No pertinent family history.   ROS:  Please see the history of present illness.     All other systems reviewed and negative.    Physical Exam: Blood pressure 118/76, pulse 82, height '5\' 9"'$  (1.753 m), weight 296 lb 4 oz (134.4 kg), last menstrual period 07/01/2022, SpO2 99 %. General: Well developed, well nourished female in no acute distress. Head: Normocephalic, atraumatic, sclera non-icteric, no xanthomas, nares are without discharge. EENT: normal  Lymph Nodes:  none Neck: Negative for carotid bruits. JVD not elevated. Back:without scoliosis kyphosis Lungs: Clear bilaterally to auscultation without wheezes, rales, or rhonchi. Breathing is unlabored. Heart: RRR with S1 S2. No systolic murmur . No rubs, or gallops appreciated. Abdomen: Soft, non-tender, non-distended with normoactive bowel sounds. No hepatomegaly. No rebound/guarding. No obvious abdominal masses. Msk:  Strength and tone appear normal for age. Extremities: No clubbing or cyanosis. edema.  Distal pedal pulses are 2+ and equal bilaterally. Skin: Warm and Dry Neuro: Alert and oriented X 3. CN III-XII intact Grossly normal sensory and motor function . Psych:  Responds to questions appropriately with a normal affect.        EKG: sinus @ 82 07/05/38 rsr'   Assessment and Plan:  RSR prime without evidence of right bundle blanch block or ASD  Dysautonomia with orthostatic intolerance  Idiopathic intracranial hypertension (no reports that I can find other associated with dysautonomia)  PCOS  Anxiety/depression  Obesity and intolerant of Wegovy  The RSR prime is I think immaterial in the setting of a normal echo and negative bubble study for an ASD.  Reassured  Has symptoms consistent with dysautonomia.  We discussed extensively the issues of dysautonomia, the physiology of orthstasis and positional stress.  We discussed the role of salt and water repletion, the  importance of exercise, often needing to be started in the recumbent position, and the awareness of triggers and the role of ambient heat and dehydration; she also needs to check with her neurosurgeons to see whether the salt and water efforts are contraindicated by her intracranial hypertension.  Hence we discussed the importance of nonpharmacological therapies including abdominal and thigh compression.  Suggested she discuss with her OB/GYN on hormonal suppression (I gather the hormonal therapies are a problem with I IC)    Discussed also the importance of managing anxiety and the losses attendant with her chronic illnesses admitted to [facility]      Virl Axe

## 2022-07-24 ENCOUNTER — Encounter: Payer: Self-pay | Admitting: Internal Medicine

## 2022-07-24 ENCOUNTER — Ambulatory Visit: Payer: 59 | Attending: Internal Medicine | Admitting: Internal Medicine

## 2022-07-24 VITALS — BP 118/76 | HR 82 | Ht 69.0 in | Wt 296.2 lb

## 2022-07-24 DIAGNOSIS — R55 Syncope and collapse: Secondary | ICD-10-CM

## 2022-07-24 NOTE — Patient Instructions (Addendum)
Medication Instructions:  - Your physician recommends that you continue on your current medications as directed. Please refer to the Current Medication list given to you today.  *If you need a refill on your cardiac medications before your next appointment, please call your pharmacy*   Lab Work: - none ordered  If you have labs (blood work) drawn today and your tests are completely normal, you will receive your results only by: [place] (if you have MyChart) OR A paper copy in the mail If you have any lab test that is abnormal or we need to change your treatment, we will call you to review the results.   Testing/Procedures: - none ordered   Follow-Up: At [facility], you and your health needs are our priority.  As part of our continuing mission to provide you with exceptional heart care, we have created designated Provider Care Teams.  These Care Teams include your primary Cardiologist (physician) and Advanced Practice Providers (APPs -  Physician Assistants and Nurse Practitioners) who all work together to provide you with the care you need, when you need it.  We recommend signing up for the patient portal called "MyChart".  Sign up information is provided on this After Visit Summary.  MyChart is used to connect with patients for Virtual Visits (Telemedicine).  Patients are able to view lab/test results, encounter notes, upcoming appointments, etc.  Non-urgent messages can be sent to your provider as well.   To learn more about what you can do with MyChart, go to [email or web address].    Your next appointment:   6 month(s)  The format for your next appointment:   In Person  Provider:   Virl Axe, MD    Other Instructions  [facility] [address], [place], [place] [address] [phone] ext. [phone]  Important Information About Sugar

## 2022-07-27 DIAGNOSIS — Z760 Encounter for issue of repeat prescription: Secondary | ICD-10-CM | POA: Diagnosis not present

## 2022-08-04 ENCOUNTER — Other Ambulatory Visit (HOSPITAL_COMMUNITY): Payer: Self-pay

## 2022-08-14 ENCOUNTER — Other Ambulatory Visit: Payer: Self-pay

## 2022-08-14 ENCOUNTER — Other Ambulatory Visit (HOSPITAL_BASED_OUTPATIENT_CLINIC_OR_DEPARTMENT_OTHER): Payer: Self-pay

## 2022-08-14 MED ORDER — FUROSEMIDE 20 MG PO TABS
20.0000 mg | ORAL_TABLET | Freq: Two times a day (BID) | ORAL | 1 refills | Status: DC | PRN
  Filled 2022-08-14: qty 60, 30d supply, fill #0

## 2022-08-29 ENCOUNTER — Other Ambulatory Visit (HOSPITAL_BASED_OUTPATIENT_CLINIC_OR_DEPARTMENT_OTHER): Payer: Self-pay

## 2022-09-23 DIAGNOSIS — G4733 Obstructive sleep apnea (adult) (pediatric): Secondary | ICD-10-CM | POA: Diagnosis not present

## 2022-10-06 ENCOUNTER — Other Ambulatory Visit: Payer: Self-pay

## 2022-11-09 IMAGING — DX DG CHEST 2V
2 series · 2 of 2 positions shown · non-contrast
Comparison: 10/01/2017

CLINICAL DATA: Cough.  Vomiting.

EXAM:
CHEST - 2 VIEW

[chest pa]
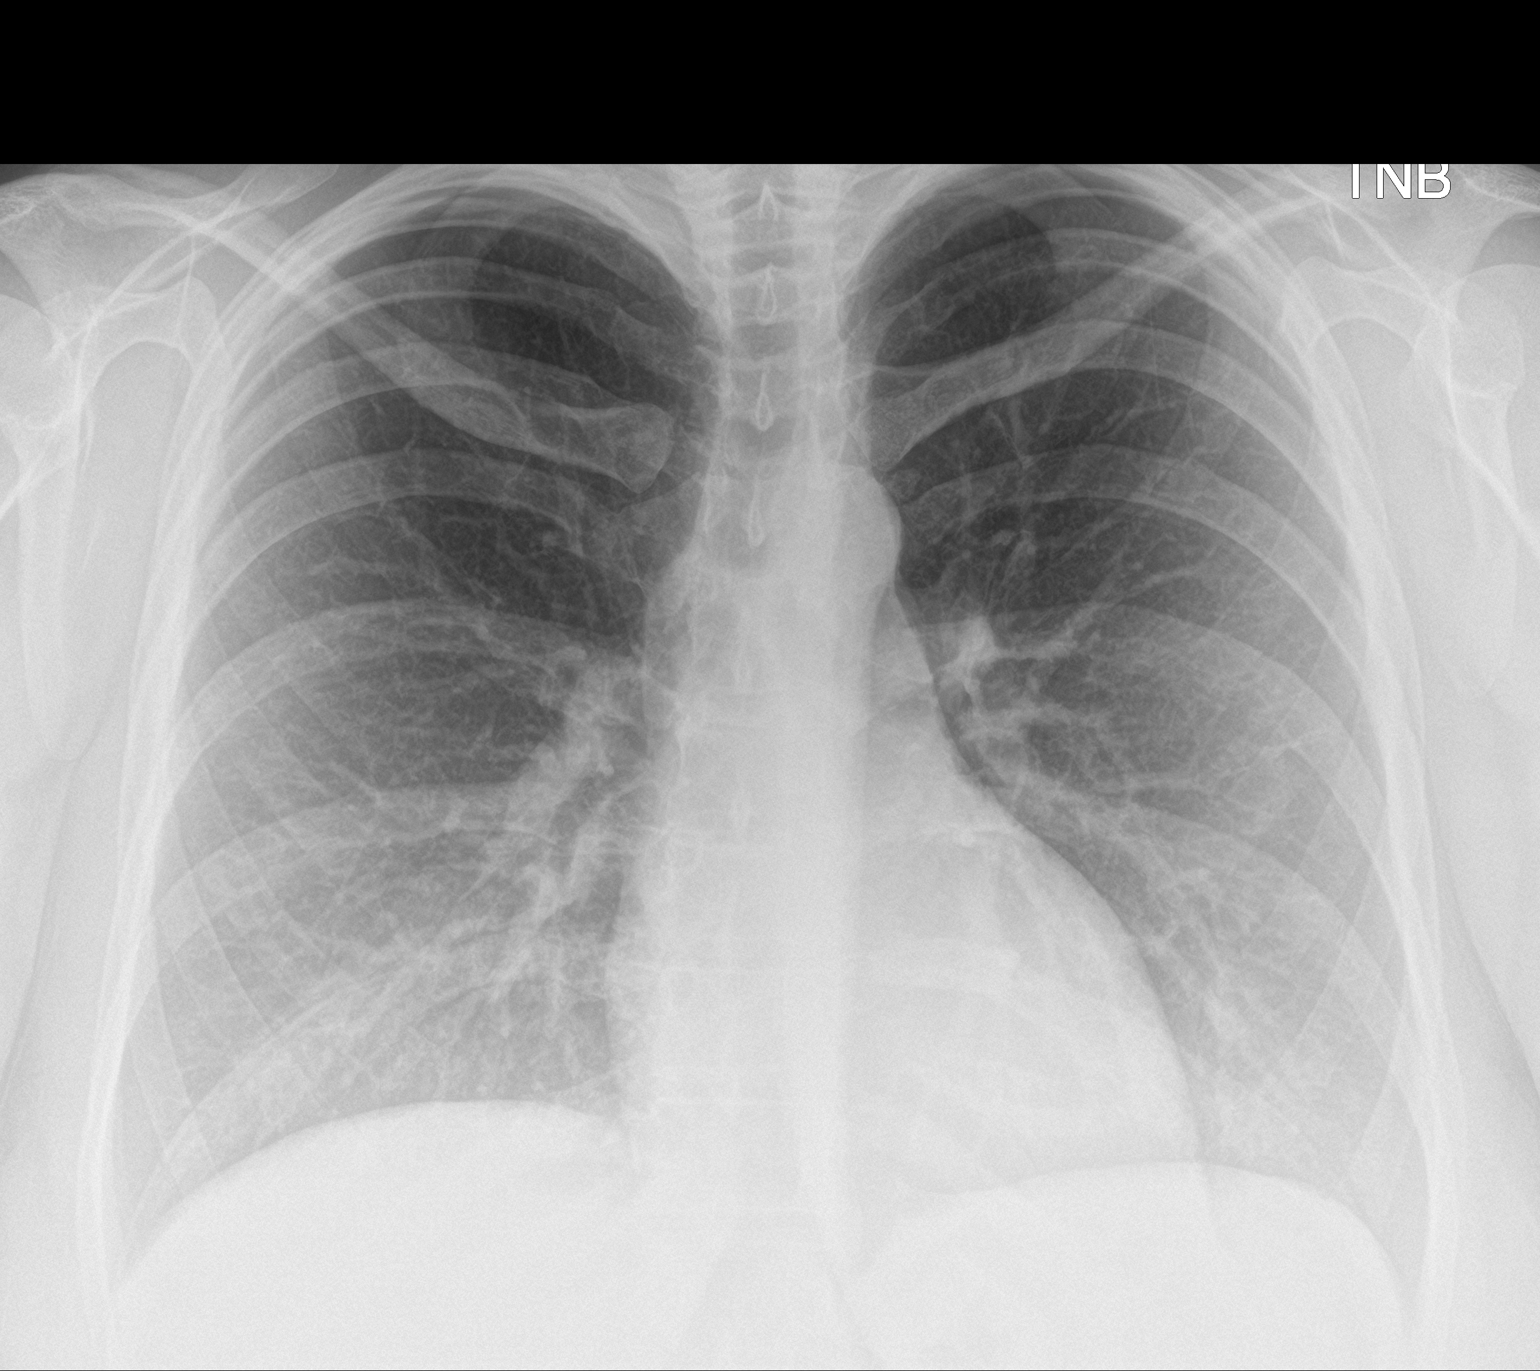

[chest lat]
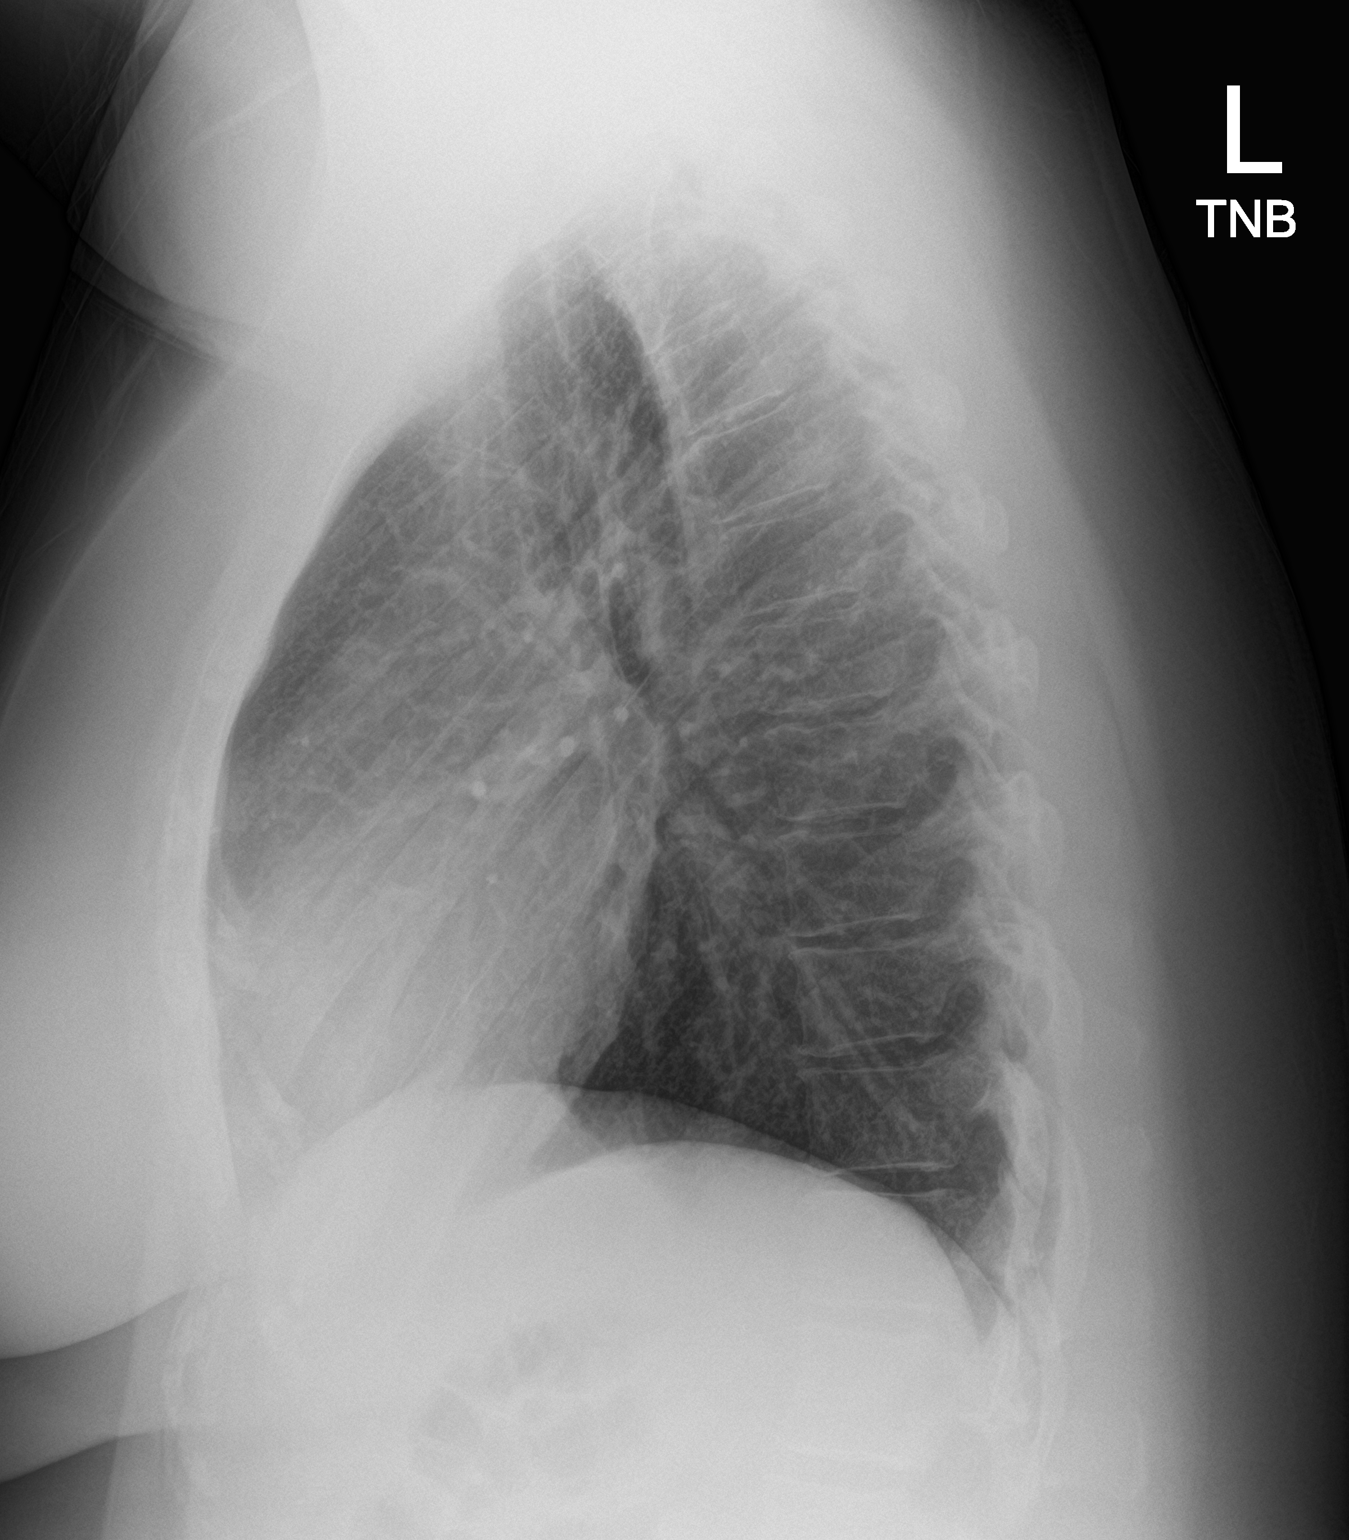

[2 of 2 positions shown; findings below may reference images not displayed]

FINDINGS: Both lungs are clear. Heart and mediastinum are within normal
limits. Trachea is midline. Negative for a pneumothorax. No pleural
effusions. Bone structures are unremarkable.
IMPRESSION: No active cardiopulmonary disease.

## 2022-12-05 ENCOUNTER — Other Ambulatory Visit: Payer: Self-pay

## 2022-12-18 ENCOUNTER — Encounter: Payer: Self-pay | Admitting: Obstetrics and Gynecology

## 2022-12-18 ENCOUNTER — Other Ambulatory Visit (HOSPITAL_BASED_OUTPATIENT_CLINIC_OR_DEPARTMENT_OTHER): Payer: Self-pay

## 2022-12-19 ENCOUNTER — Other Ambulatory Visit: Payer: Self-pay

## 2022-12-23 ENCOUNTER — Other Ambulatory Visit: Payer: Self-pay | Admitting: Obstetrics and Gynecology

## 2022-12-23 DIAGNOSIS — N6489 Other specified disorders of breast: Secondary | ICD-10-CM

## 2022-12-29 ENCOUNTER — Other Ambulatory Visit: Payer: Self-pay

## 2022-12-29 DIAGNOSIS — D485 Neoplasm of uncertain behavior of skin: Secondary | ICD-10-CM | POA: Diagnosis not present

## 2022-12-29 DIAGNOSIS — L814 Other melanin hyperpigmentation: Secondary | ICD-10-CM | POA: Diagnosis not present

## 2022-12-29 DIAGNOSIS — D235 Other benign neoplasm of skin of trunk: Secondary | ICD-10-CM | POA: Diagnosis not present

## 2022-12-29 DIAGNOSIS — D0372 Melanoma in situ of left lower limb, including hip: Secondary | ICD-10-CM | POA: Diagnosis not present

## 2022-12-31 DIAGNOSIS — G4733 Obstructive sleep apnea (adult) (pediatric): Secondary | ICD-10-CM | POA: Diagnosis not present

## 2023-01-02 ENCOUNTER — Other Ambulatory Visit: Payer: Self-pay

## 2023-01-02 ENCOUNTER — Other Ambulatory Visit (HOSPITAL_BASED_OUTPATIENT_CLINIC_OR_DEPARTMENT_OTHER): Payer: Self-pay

## 2023-01-15 ENCOUNTER — Ambulatory Visit: Payer: 59 | Attending: Internal Medicine | Admitting: Internal Medicine

## 2023-01-26 DIAGNOSIS — D0371 Melanoma in situ of right lower limb, including hip: Secondary | ICD-10-CM | POA: Diagnosis not present

## 2023-02-04 DIAGNOSIS — Z08 Encounter for follow-up examination after completed treatment for malignant neoplasm: Secondary | ICD-10-CM | POA: Diagnosis not present

## 2023-02-04 DIAGNOSIS — D235 Other benign neoplasm of skin of trunk: Secondary | ICD-10-CM | POA: Diagnosis not present

## 2023-02-04 DIAGNOSIS — Z86006 Personal history of melanoma in-situ: Secondary | ICD-10-CM | POA: Diagnosis not present

## 2023-02-04 DIAGNOSIS — L814 Other melanin hyperpigmentation: Secondary | ICD-10-CM | POA: Diagnosis not present

## 2023-02-04 DIAGNOSIS — D2361 Other benign neoplasm of skin of right upper limb, including shoulder: Secondary | ICD-10-CM | POA: Diagnosis not present

## 2023-02-25 DIAGNOSIS — N6322 Unspecified lump in the left breast, upper inner quadrant: Secondary | ICD-10-CM | POA: Diagnosis not present

## 2023-02-26 ENCOUNTER — Ambulatory Visit (INDEPENDENT_AMBULATORY_CARE_PROVIDER_SITE_OTHER): Payer: 59 | Admitting: Obstetrics & Gynecology

## 2023-02-26 ENCOUNTER — Encounter: Payer: Self-pay | Admitting: Obstetrics & Gynecology

## 2023-02-26 VITALS — BP 118/49 | HR 72 | Ht 69.0 in | Wt 316.0 lb

## 2023-02-26 DIAGNOSIS — N914 Secondary oligomenorrhea: Secondary | ICD-10-CM

## 2023-02-26 DIAGNOSIS — E282 Polycystic ovarian syndrome: Secondary | ICD-10-CM

## 2023-02-26 NOTE — Progress Notes (Signed)
    GYNECOLOGY OFFICE VISIT NOTE  History:   Carol Porter is a 32 y.o. G0P0000 with known PCOS here today for discussion of slightly longer interval period between two last menstrual cycles.  Had periods on 10/07/22 and then 02/02/23. Usually misses 1-2 months, never 4 months. Took a lot of home UPTs, that were negative. No other symptoms.  She denies any abnormal vaginal discharge, bleeding, pelvic pain or other concerns.    Past Medical History:  Diagnosis Date   Allergy    Anxiety    Asthma    Depression    Idiopathic intracranial hypertension    Shingles     Past Surgical History:  Procedure Laterality Date   LUMBAR PUNCTURE      The following portions of the patient's history were reviewed and updated as appropriate: allergies, current medications, past family history, past medical history, past social history, past surgical history and problem list.   Health Maintenance:  Normal pap and negative HRHPV on 02/14/2022.     Review of Systems:  Pertinent items noted in HPI and remainder of comprehensive ROS otherwise negative.  Physical Exam:  BP (!) 118/49   Pulse 72   Ht 5\' 9"  (1.753 m)   Wt (!) 316 lb (143.3 kg)   LMP 02/02/2023 (Exact Date)   BMI 46.67 kg/m  CONSTITUTIONAL: Well-developed, well-nourished female in no acute distress.  HEENT:  Normocephalic, atraumatic. External right and left ear normal. No scleral icterus.  NECK: Normal range of motion, supple, no masses noted on observation SKIN: No rash noted. Not diaphoretic. No erythema. No pallor. MUSCULOSKELETAL: Normal range of motion. No edema noted. NEUROLOGIC: Alert and oriented to person, place, and time. Normal muscle tone coordination. No cranial nerve deficit noted. PSYCHIATRIC: Normal mood and affect. Normal behavior. Normal judgment and thought content. CARDIOVASCULAR: Normal heart rate noted RESPIRATORY: Effort and breath sounds normal, no problems with respiration noted ABDOMEN: No masses noted. No  other overt distention noted.   PELVIC: Deferred    Assessment and Plan:     1. Secondary oligomenorrhea 2. PCOS (polycystic ovarian syndrome) Reassured patient that this can be seen in PCOS, and that it is reassuring that pregnancy has been ruled out. Offered evaluation with serum HCG or TSH to check for other etiologies, she declines. Talked about PCOS therapies; she was on Wegovy in the past but had cardiac side effects.  Still working on weight loss.  Considering Metformin, has never taken this.  Does not want OCPs.  She was told to call us if she has any further concerns or wants to start any other PCOS therapy.   Routine preventative health maintenance measures emphasized. Please refer to After Visit Summary for other counseling recommendations.   Return for any gynecologic concerns.    I spent 30 minutes dedicated to the care of this patient including pre-visit review of records, face to face time with the patient discussing her conditions and treatments and post visit orders.    Jaynie Collins, MD, FACOG Obstetrician & Gynecologist, [facility] for [employer], [facility]

## 2023-02-27 ENCOUNTER — Other Ambulatory Visit (HOSPITAL_BASED_OUTPATIENT_CLINIC_OR_DEPARTMENT_OTHER): Payer: Self-pay

## 2023-02-27 DIAGNOSIS — E282 Polycystic ovarian syndrome: Secondary | ICD-10-CM | POA: Diagnosis not present

## 2023-02-27 DIAGNOSIS — Z0000 Encounter for general adult medical examination without abnormal findings: Secondary | ICD-10-CM | POA: Diagnosis not present

## 2023-02-27 DIAGNOSIS — F411 Generalized anxiety disorder: Secondary | ICD-10-CM | POA: Diagnosis not present

## 2023-02-27 DIAGNOSIS — Z6841 Body Mass Index (BMI) 40.0 and over, adult: Secondary | ICD-10-CM | POA: Diagnosis not present

## 2023-02-27 DIAGNOSIS — G4733 Obstructive sleep apnea (adult) (pediatric): Secondary | ICD-10-CM | POA: Diagnosis not present

## 2023-02-27 MED ORDER — FLUOXETINE HCL 20 MG PO TABS
20.0000 mg | ORAL_TABLET | Freq: Every day | ORAL | 4 refills | Status: DC
  Filled 2023-02-27 – 2023-03-23 (×2): qty 90, 90d supply, fill #0

## 2023-03-02 ENCOUNTER — Encounter: Payer: Self-pay | Admitting: General Surgery

## 2023-03-02 DIAGNOSIS — N6322 Unspecified lump in the left breast, upper inner quadrant: Secondary | ICD-10-CM

## 2023-03-02 DIAGNOSIS — R928 Other abnormal and inconclusive findings on diagnostic imaging of breast: Secondary | ICD-10-CM

## 2023-03-23 ENCOUNTER — Other Ambulatory Visit (HOSPITAL_BASED_OUTPATIENT_CLINIC_OR_DEPARTMENT_OTHER): Payer: Self-pay

## 2023-04-07 ENCOUNTER — Other Ambulatory Visit: Payer: Self-pay | Admitting: General Surgery

## 2023-04-07 DIAGNOSIS — N6322 Unspecified lump in the left breast, upper inner quadrant: Secondary | ICD-10-CM

## 2023-04-07 DIAGNOSIS — R928 Other abnormal and inconclusive findings on diagnostic imaging of breast: Secondary | ICD-10-CM

## 2023-04-17 ENCOUNTER — Other Ambulatory Visit: Payer: 59

## 2023-04-22 ENCOUNTER — Encounter: Payer: Self-pay | Admitting: Internal Medicine

## 2023-04-22 ENCOUNTER — Ambulatory Visit: Payer: 59

## 2023-04-22 ENCOUNTER — Ambulatory Visit
Admission: EM | Admit: 2023-04-22 | Discharge: 2023-04-22 | Disposition: A | Discharge status: Home / Self Care | Payer: 59 | Attending: Emergency Medicine | Admitting: Emergency Medicine

## 2023-04-22 ENCOUNTER — Other Ambulatory Visit: Payer: Self-pay

## 2023-04-22 DIAGNOSIS — R42 Dizziness and giddiness: Secondary | ICD-10-CM

## 2023-04-22 DIAGNOSIS — N760 Acute vaginitis: Secondary | ICD-10-CM | POA: Diagnosis not present

## 2023-04-22 DIAGNOSIS — R0602 Shortness of breath: Secondary | ICD-10-CM | POA: Diagnosis not present

## 2023-04-22 DIAGNOSIS — B9689 Other specified bacterial agents as the cause of diseases classified elsewhere: Secondary | ICD-10-CM | POA: Insufficient documentation

## 2023-04-22 DIAGNOSIS — Z8639 Personal history of other endocrine, nutritional and metabolic disease: Secondary | ICD-10-CM

## 2023-04-22 LAB — CBC WITH DIFFERENTIAL/PLATELET
Abs Immature Granulocytes: 0.07 10*3/uL (ref 0.00–0.07)
Basophils Absolute: 0 10*3/uL (ref 0.0–0.1)
Basophils Relative: 0 %
Eosinophils Absolute: 0.1 10*3/uL (ref 0.0–0.5)
Eosinophils Relative: 0 %
HCT: 39 % (ref 36.0–46.0)
Hemoglobin: 12.9 g/dL (ref 12.0–15.0)
Immature Granulocytes: 1 %
Lymphocytes Relative: 13 %
Lymphs Abs: 1.6 10*3/uL (ref 0.7–4.0)
MCH: 28.5 pg (ref 26.0–34.0)
MCHC: 33.1 g/dL (ref 30.0–36.0)
MCV: 86.3 fL (ref 80.0–100.0)
Monocytes Absolute: 0.8 10*3/uL (ref 0.1–1.0)
Monocytes Relative: 6 %
Neutro Abs: 9.8 10*3/uL — ABNORMAL HIGH (ref 1.7–7.7)
Neutrophils Relative %: 80 %
Platelets: 287 10*3/uL (ref 150–400)
RBC: 4.52 MIL/uL (ref 3.87–5.11)
RDW: 13.6 % (ref 11.5–15.5)
WBC: 12.4 10*3/uL — ABNORMAL HIGH (ref 4.0–10.5)
nRBC: 0 % (ref 0.0–0.2)

## 2023-04-22 LAB — URINALYSIS, W/ REFLEX TO CULTURE (INFECTION SUSPECTED)
Bilirubin Urine: NEGATIVE
Glucose, UA: NEGATIVE mg/dL
Ketones, ur: NEGATIVE mg/dL
Leukocytes,Ua: NEGATIVE
Nitrite: NEGATIVE
Protein, ur: NEGATIVE mg/dL
Specific Gravity, Urine: 1.02 (ref 1.005–1.030)
pH: 6 (ref 5.0–8.0)

## 2023-04-22 LAB — WET PREP, GENITAL
Sperm: NONE SEEN
Trich, Wet Prep: NONE SEEN
WBC, Wet Prep HPF POC: >10 — AB (ref <10)
Yeast Wet Prep HPF POC: NONE SEEN

## 2023-04-22 LAB — COMPREHENSIVE METABOLIC PANEL
ALT: 19 U/L (ref 0–44)
AST: 19 U/L (ref 15–41)
Albumin: 4.1 g/dL (ref 3.5–5.0)
Alkaline Phosphatase: 80 U/L (ref 38–126)
Anion gap: 9 (ref 5–15)
BUN: 13 mg/dL (ref 6–20)
CO2: 27 mmol/L (ref 22–32)
Calcium: 9.2 mg/dL (ref 8.9–10.3)
Chloride: 101 mmol/L (ref 98–111)
Creatinine, Ser: 0.69 mg/dL (ref 0.44–1.00)
GFR, Estimated: >60 mL/min (ref >60)
Glucose, Bld: 126 mg/dL — ABNORMAL HIGH (ref 70–99)
Potassium: 3.8 mmol/L (ref 3.5–5.1)
Sodium: 137 mmol/L (ref 135–145)
Total Bilirubin: 0.5 mg/dL (ref 0.3–1.2)
Total Protein: 7.9 g/dL (ref 6.5–8.1)

## 2023-04-22 MED ORDER — CLINDAMYCIN PHOSPHATE 2 % VA CREA
1.0000 | TOPICAL_CREAM | Freq: Every day | VAGINAL | 0 refills | Status: DC
  Filled 2023-04-22: qty 40, 7d supply, fill #0

## 2023-04-22 MED ORDER — ONDANSETRON 8 MG PO TBDP
8.0000 mg | ORAL_TABLET | Freq: Three times a day (TID) | ORAL | 0 refills | Status: DC | PRN
  Filled 2023-04-22: qty 20, 7d supply, fill #0

## 2023-04-22 NOTE — Discharge Instructions (Signed)
Insert 1 applicatorful of clindamycin vaginal gel at bedtime for treatment of your bacterial vaginosis for 7 days.  Avoid alcohol while on the metronidazole as taken together will cause of vomiting.  Bacterial vaginosis is often caused by a imbalance of bacteria in your vaginal vault.  This is sometimes a result of using tampons or hormonal fluctuations during her menstrual cycle.  You if your symptoms are recurrent you can try using a boric acid suppository twice weekly to help maintain the acid-base balance in your vagina vault which could prevent further infection.  You can also try vaginal probiotics to help return normal bacterial balance.

## 2023-04-22 NOTE — ED Provider Notes (Addendum)
[facility]    CSN: [id number] Arrival date & time: 04/22/23  0913      History   Chief Complaint Chief Complaint  Patient presents with   Dizziness    HPI Carol Porter is a 32 y.o. female.   HPI  32 year old female with a past medical history significant for benign intracranial hypertension, anxiety, ADHD, migraine headaches, OSA on CPAP, positive ANA, and PCOS presents for evaluation of feeling faint when she woke up this morning.  She states that when she woke up and was still supine she felt like she was going to pass out.  She closed her eyes did some breathing and this passed approximately 10 seconds.  She is set up slowly and had a return of symptoms.  She was able to drink some water which helped improve her symptoms and then slowly walk to her bathroom where she had to sit down.  She has not had loss of consciousness at any point.  She does endorse some nausea, pressure type headache, and some blurry vision.  She also reports that she felt some shortness of breath yesterday.  She did take a dose of Lasix, which she takes for her benign intracranial hypertension, because she was feeling fluid overloaded and voided approximately 800 mL of fluid and reports that she felt better afterwards.  She denies any chest pain, shortness breath, or palpitations today.  She had COVID 2 weeks ago and reports that she has been feeling weak ever since.  She is concerned about possible electrolyte abnormality given that after she was able to get up and stand she did experience cramps in the muscles of her legs.  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) 04/12/2020   OSA on CPAP 11/17/2018   Positive ANA (antinuclear antibody) 08/11/2018   PCOS (polycystic ovarian syndrome) 06/14/2018   Inappropriate sinus tachycardia 11/04/2017   Mild intermittent asthma without complication 05/20/2016   ADHD, predominantly inattentive type 10/22/2015   Other specified eating disorder 08/31/2015   Anxiety disorder, unspecified 08/03/2015   Moderate episode of recurrent major depressive disorder (HCC) 08/03/2015   Unspecified hemorrhoids 08/03/2015   Classical migraine with intractable migraine 06/30/2014   Epigastric pain 05/12/2014   History of irritable bowel syndrome 05/12/2014    Past Surgical History:  Procedure Laterality Date   LUMBAR PUNCTURE      OB History     Gravida  0   Para  0   Term  0   Preterm  0   AB  0   Living  0      SAB  0   IAB  0   Ectopic  0   Multiple  0   Live Births  0            Home Medications    Prior to Admission medications   Medication Sig Start Date End Date Taking? Authorizing Provider  FLUoxetine (PROZAC) 20 MG tablet Take 1 tablet (20 mg total) by mouth daily. 02/27/23  Yes   Albuterol Sulfate (PROAIR RESPICLICK) 108 (90 Base) MCG/ACT AEPB Take by  mouth. 06/14/15   [provider]  amoxicillin-clavulanate (AUGMENTIN) 875-125 MG tablet Take 1 tablet by mouth 2 (two) times daily for 10 days. Patient not taking: Reported on 07/24/2022 07/03/22   Borders, Daryl Eastern, NP  aspirin 81 MG EC tablet Take by mouth. Patient not taking: Reported on 07/24/2022    [provider]  botulinum toxin Type A (BOTOX) 200 units injection INJECT 200 UNITS INTO THE MUSCLES OF MULTIPLE SITES OF THE FACE AND NECK BY PHYSICIAN ONCE EVERY 3 MONTHS. Patient not taking: Reported on 07/24/2022 08/02/21     botulinum toxin Type A (BOTOX) 200 units injection INJECT 200 UNITS INTO THE MUSCLES OF MULTIPLE SITES OF THE FACE AND NECK BY PHYSICIAN ONCE EVERY 3  MONTHS. Patient not taking: Reported on 07/24/2022 06/18/22     Botulinum Toxin Type A (BOTOX) 200 units SOLR INJECT 200 UNITS INTO THE MUSCLES OF MULTIPLE SITES OF THE FACE AND NECK BY PHYSICIAN ONCE EVERY 3 MONTHS. Patient not taking: Reported on 07/24/2022 01/16/21     clindamycin (CLEOCIN) 2 % vaginal cream Place 1 Applicatorful vaginally at bedtime. 04/22/23  Yes Becky Augusta, NP  EPINEPHrine (EPIPEN 2-PAK) 0.3 mg/0.3 mL IJ SOAJ injection Inject intramuscularly in the event of an emergent allergic reaction with difficulty breathing. Patient not taking: Reported on 07/24/2022 04/15/21     FLUoxetine (PROZAC) 10 MG tablet Take 1 and 1/2 tablets by mouth once daily Patient not taking: Reported on 07/24/2022 03/22/21     FLUoxetine (PROZAC) 10 MG tablet Take 1 and 1/2 tablets by mouth once daily. Patient not taking: Reported on 02/26/2023 11/21/21     furosemide (LASIX) 20 MG tablet Take 1 tablet (20 mg total) by mouth 2 (two)  times daily as needed. 06/07/21     furosemide (LASIX) 20 MG tablet Take 1 tablet (20 mg total) by mouth 2 (two) times daily as needed. Patient not taking: Reported on 02/26/2023 08/14/22     furosemide (LASIX) 20 MG tablet Take 1 tablet (20 mg total) by mouth 2 (two) times daily as needed. Patient not taking: Reported on 02/26/2023 08/14/22     hydrocortisone (ANUSOL-HC) 25 MG suppository Place rectally. Patient not taking: Reported on 07/24/2022 05/02/20   [provider]  hydrocortisone (ANUSOL-HC) 25 MG suppository Place 1 suppository (25 mg total) rectally 2 (two) times daily as needed for Hemorrhoids 11/21/21     ipratropium (ATROVENT) 0.06 % nasal spray Place 2 sprays into both nostrils 4 (four) times daily. Patient not taking: Reported on 02/26/2023 09/22/21   Becky Augusta, NP  levalbuterol [facility] HFA) 45 MCG/ACT inhaler Inhale 2 inhalations into the lungs every 6 (six) hours as needed for Wheezing 11/27/21     Magnesium Bisglycinate (MAG GLYCINATE) 100 MG TABS Take by  mouth daily. Patient not taking: Reported on 02/26/2023    [provider]  methylPREDNISolone (MEDROL DOSEPAK) 4 MG TBPK tablet Use as directed. Patient not taking: Reported on 07/24/2022 07/03/22   Josph Macho, MD  Multiple Vitamins-Iron (MULTI-VITAMIN/IRON PO) Take by mouth daily.    [provider]  Omega-3 1000 MG CAPS Take by mouth daily.    [provider]  ondansetron (ZOFRAN-ODT) 8 MG disintegrating tablet Take 1 tablet (8 mg total) by mouth every 8 (eight) hours as needed for nausea or vomiting. 04/22/23  Yes Becky Augusta, NP  promethazine (PHENERGAN) 12.5 MG suppository Place 1 suppository (12.5 mg total) rectally every 6 (six) hours as needed for nausea or vomiting. 10/01/21   Alycia Rossetti,  Riki Rusk, NP  ranitidine (ZANTAC) 75 MG tablet Take 75 mg by mouth as needed.     [provider]  Semaglutide-Weight Management (WEGOVY) 0.25 MG/0.5ML SOAJ Inject 1 mL (0.5 mg total) subcutaneously once a week Patient not taking: Reported on 07/24/2022 02/18/22     Semaglutide-Weight Management 0.25 MG/0.5ML SOAJ Inject 0.25 mg into the skin once weekly. Patient not taking: Reported on 07/24/2022 11/21/21     triamcinolone (NASACORT) 55 MCG/ACT AERO nasal inhaler Use 2 sprays in each nostril once daily Patient not taking: Reported on 07/24/2022 04/15/21     sucralfate (CARAFATE) 1 g tablet Take 1 tablet by mouth 4 (four) times daily. 09/30/17 11/07/20  [provider]  SYEDA 3-0.03 MG tablet Take 1 tablet by mouth daily. 09/30/17 11/07/20  [provider]    Family History History reviewed. No pertinent family history.  Social History Social History   Tobacco Use   Smoking status: Never   Smokeless tobacco: Never  Vaping Use   Vaping status: Never Used  Substance Use Topics   Alcohol use: Yes    Comment: social   Drug use: No     Allergies   Omeprazole, Flagyl [metronidazole], Oxycodone-acetaminophen, Phentermine, and  Sulfamethoxazole-trimethoprim   Review of Systems Review of Systems  Respiratory:  Positive for shortness of breath.   Cardiovascular:  Negative for chest pain, palpitations and leg swelling.  Gastrointestinal:  Positive for nausea.  Neurological:  Positive for dizziness, light-headedness and headaches. Negative for syncope.     Physical Exam Triage Vital Signs ED Triage Vitals  Encounter Vitals Group     BP      Systolic BP Percentile      Diastolic BP Percentile      Pulse      Resp      Temp      Temp src      SpO2      Weight      Height      Head Circumference      Peak Flow      Pain Score      Pain Loc      Pain Education      Exclude from Growth Chart    No data found.  Updated Vital Signs BP 130/75 (BP Location: Right Arm)   Pulse 90   Temp 98.6 F (37 C) (Oral)   Resp 18   Wt (!) 310 lb (140.6 kg)   SpO2 98%   BMI 45.78 kg/m   Visual Acuity Right Eye Distance:   Left Eye Distance:   Bilateral Distance:    Right Eye Near:   Left Eye Near:    Bilateral Near:     Physical Exam Vitals and nursing note reviewed.  Constitutional:      Appearance: Normal appearance. She is not ill-appearing.  HENT:     Head: Normocephalic and atraumatic.  Eyes:     General: No scleral icterus.    Extraocular Movements: Extraocular movements intact.     Conjunctiva/sclera: Conjunctivae normal.     Pupils: Pupils are equal, round, and reactive to light.     Comments: Patient had a slight increase of dizziness with EOM assessment.  Cardiovascular:     Rate and Rhythm: Normal rate and regular rhythm.     Pulses: Normal pulses.     Heart sounds: Normal heart sounds. No murmur heard.    No friction rub. No gallop.  Pulmonary:     Effort: Pulmonary effort is  normal.     Breath sounds: Normal breath sounds. No wheezing, rhonchi or rales.  Skin:    General: Skin is warm.     Capillary Refill: Capillary refill takes less than 2 seconds.     Findings: No erythema  or rash.  Neurological:     General: No focal deficit present.     Mental Status: She is alert and oriented to person, place, and time.      UC Treatments / Results  Labs (all labs ordered are listed, but only abnormal results are displayed) Labs Reviewed  WET PREP, GENITAL - Abnormal; Notable for the following components:      Result Value   Clue Cells Wet Prep HPF POC PRESENT (*)    WBC, Wet Prep HPF POC >10 (*)    All other components within normal limits  CBC WITH DIFFERENTIAL/PLATELET - Abnormal; Notable for the following components:   WBC 12.4 (*)    Neutro Abs 9.8 (*)    All other components within normal limits  COMPREHENSIVE METABOLIC PANEL - Abnormal; Notable for the following components:   Glucose, Bld 126 (*)    All other components within normal limits  URINALYSIS, W/ REFLEX TO CULTURE (INFECTION SUSPECTED) - Abnormal; Notable for the following components:   Hgb urine dipstick TRACE (*)    Bacteria, UA MANY (*)    All other components within normal limits    EKG Sinus tachycardia with ventricular to 103 bpm PR interval 126 ms QRS duration 82 ms QT/QTc 358/460 ms Right atrial enlargement with RSR QR pattern in V1, ST and T wave abnormality.  Radiology DG Chest 2 View  Result Date: 04/22/2023 CLINICAL DATA:  Shortness of breath. EXAM: CHEST - 2 VIEW COMPARISON:  August 30, 2021. FINDINGS: The heart size and mediastinal contours are within normal limits. Both lungs are clear. The visualized skeletal structures are unremarkable. IMPRESSION: No active cardiopulmonary disease. Electronically Signed   By: Lupita Raider M.D.   On: 04/22/2023 11:14    Procedures Procedures (including critical care time)  Medications Ordered in UC Medications - No data to display  Initial Impression / Assessment and Plan / UC Course  I have reviewed the triage vital signs and the nursing notes.  Pertinent labs & imaging results that were available during my care of the patient  were reviewed by me and considered in my medical decision making (see chart for details).   Patient is a pleasant, nontoxic-appearing 32 year old female presenting for evaluation of presyncope as outlined HPI above.  She reports that she is feeling better and she has been able to drink some bone broth as well as some avocado juice but is still feeling off.  Cranial nerves II through XII are grossly intact.  Pupils are equal and reactive and EOMs intact, without EOM assessment did cause a slight increase to her dizziness.  Cardiopulmonary exam reveals S1-S2 heart sounds with regular rate and rhythm and lung sounds that are clear to auscultation all fields.  Patient has no pretibial edema or edema noted to her lower legs.  Given that she recently took a dose of Lasix and diuresed a large amount of fluid there is concern for possible electrolyte abnormality.  I will check CBC and CMP.  I will also check an EKG to evaluate for any source of arrhythmia.  Patient does have a history of RBBB on her EKG.  She is concerned about possible fluid overload and was requesting a proBNP.  I advised  her that we would not get them back today.  I will obtain a chest x-ray to evaluate for any presence of fluid overload.  The patient is able to speak in full sentence without dyspnea or tachypnea.  Respiratory rate at triage was 18 with a 98% room air oxygen saturation.  BP is normal at 130/75.  EKG shows sinus tachycardia with a ventricular rate of 103 bpm.  The tracing is reading right atrial enlargement with an RSR or QR pattern in V1 suggesting right ventricular conduction delay as well as ST and T wave abnormality.  When compared to EKG from 03/11/2022 there is no appreciable change.  CBC shows elevated white count of 12.4 but H&H are normal at 12.9 and 39 respectively.  Platelets normal at 287.  Differential shows a mild increase of the neutrophil number but otherwise no abnormalities.  Chest x-ray independently reviewed and  evaluated by me.  Impression: Questionable streaky opacity in the right lung base.  No evidence of fluid overload.  Cardiomediastinal silhouette is normal.  Radiology overread is pending. Radiology impression states that there is no active cardiopulmonary disease.  CMP is unremarkable save for an elevated glucose of 126.  Patient reports that she is having some UTI symptoms so I will order UA.  Urinalysis shows trace hemoglobin but negative for leukocyte esterase, nitrates, or protein.  Reflex microscopy shows squamous cells but no WBCs or RBCs.  Many bacteria present.  I will order a vaginal wet prep.  Wet prep is positive for clue cells.  I will discharge patient home with a diagnosis of BV and started on clindamycin vaginal gel nightly x 7 days.  I will also send a prescription for Zofran for her for help with nausea.  Return precautions reviewed.  Work note provided.   Final Clinical Impressions(s) / UC Diagnoses   Final diagnoses:  BV (bacterial vaginosis)     Discharge Instructions      Insert 1 applicatorful of clindamycin vaginal gel at bedtime for treatment of your bacterial vaginosis for 7 days.  Avoid alcohol while on the metronidazole as taken together will cause of vomiting.  Bacterial vaginosis is often caused by a imbalance of bacteria in your vaginal vault.  This is sometimes a result of using tampons or hormonal fluctuations during her menstrual cycle.  You if your symptoms are recurrent you can try using a boric acid suppository twice weekly to help maintain the acid-base balance in your vagina vault which could prevent further infection.  You can also try vaginal probiotics to help return normal bacterial balance.      ED Prescriptions     Medication Sig Dispense Auth. Provider   ondansetron (ZOFRAN-ODT) 8 MG disintegrating tablet Take 1 tablet (8 mg total) by mouth every 8 (eight) hours as needed for nausea or vomiting. 20 tablet Becky Augusta, NP    clindamycin (CLEOCIN) 2 % vaginal cream Place 1 Applicatorful vaginally at bedtime. 40 g Becky Augusta, NP      PDMP not reviewed this encounter.   Becky Augusta, NP 04/22/23 1248    Becky Augusta, NP 04/28/23 [phone]

## 2023-04-22 NOTE — ED Triage Notes (Signed)
Woke up and felt faint. Fatigue muscle cramps. Its better now but she wants to be checked out. Had covid 2 weeks ago.

## 2023-04-24 DIAGNOSIS — Z713 Dietary counseling and surveillance: Secondary | ICD-10-CM | POA: Diagnosis not present

## 2023-04-24 DIAGNOSIS — Z6841 Body Mass Index (BMI) 40.0 and over, adult: Secondary | ICD-10-CM | POA: Diagnosis not present

## 2023-04-24 DIAGNOSIS — E282 Polycystic ovarian syndrome: Secondary | ICD-10-CM | POA: Diagnosis not present

## 2023-04-26 ENCOUNTER — Ambulatory Visit
Admission: RE | Admit: 2023-04-26 | Discharge: 2023-04-26 | Disposition: A | Discharge status: Home / Self Care | Payer: 59 | Source: Ambulatory Visit | Attending: Emergency Medicine | Admitting: Emergency Medicine

## 2023-04-26 VITALS — BP 126/84 | HR 82 | Temp 98.7°F | Ht 69.0 in | Wt 307.0 lb

## 2023-04-26 DIAGNOSIS — R110 Nausea: Secondary | ICD-10-CM | POA: Diagnosis not present

## 2023-04-26 MED ORDER — PROMETHAZINE HCL 12.5 MG PO TABS
12.5000 mg | ORAL_TABLET | Freq: Four times a day (QID) | ORAL | 0 refills | Status: DC | PRN
  Filled 2023-04-26: qty 30, 8d supply, fill #0

## 2023-04-26 NOTE — Discharge Instructions (Signed)
Avoid spicy greasy fried foods, take Phenergan as prescribed may cause drowsiness.  Follow-up with your PCP, return as needed.

## 2023-04-26 NOTE — ED Provider Notes (Signed)
[facility]    CSN: [id number] Arrival date & time: 04/26/23  0912      History   Chief Complaint Chief Complaint  Patient presents with   Fever    Entered by patient    HPI Carol Porter is a 32 y.o. female.   32 year old female, Carol Porter, presents to urgent care for evaluation of nausea.  Patient states she was seen here few days ago for fatigue and presyncope and had elevated white count since then Tmax temp 99.7 with nausea, concerned that she may need to recheck her CBC.  Patient states her fever has resolved and she feels better this morning however she still little nauseated, requesting Phenergan tablets.  BJY:NWGNFA intracranial hypertension, anxiety, ADHD, migraine headaches, OSA on CPAP, positive ANA, and PCOS  The history is provided by the patient. No language interpreter was used.    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) 04/12/2020   OSA on CPAP 11/17/2018   Positive ANA (antinuclear antibody) 08/11/2018   PCOS (polycystic ovarian syndrome) 06/14/2018   Inappropriate sinus tachycardia 11/04/2017   Mild intermittent asthma without complication 05/20/2016   ADHD, predominantly inattentive type 10/22/2015   Other specified eating disorder 08/31/2015   Anxiety disorder, unspecified 08/03/2015   Moderate episode of recurrent major depressive disorder (HCC) 08/03/2015   Unspecified hemorrhoids 08/03/2015   Classical migraine with intractable migraine 06/30/2014   Epigastric pain 05/12/2014   History of irritable bowel syndrome  05/12/2014    Past Surgical History:  Procedure Laterality Date   LUMBAR PUNCTURE      OB History     Gravida  0   Para  0   Term  0   Preterm  0   AB  0   Living  0      SAB  0   IAB  0   Ectopic  0   Multiple  0   Live Births  0            Home Medications    Prior to Admission medications   Medication Sig Start Date End Date Taking? Authorizing Provider  promethazine (PHENERGAN) 12.5 MG tablet Take 1 tablet (12.5 mg total) by mouth every 6 (six) hours as needed for nausea or vomiting. 04/26/23  Yes Jacorey Donaway, Para March, NP  Albuterol Sulfate (PROAIR RESPICLICK) 108 (90 Base) MCG/ACT AEPB Take by mouth. 06/14/15   [provider]  amoxicillin-clavulanate (AUGMENTIN) 875-125 MG tablet Take 1 tablet by mouth 2 (two) times daily for 10 days. Patient not taking: Reported on 07/24/2022 07/03/22   Borders, Daryl Eastern, NP  aspirin 81 MG EC tablet Take by mouth. Patient not taking: Reported on 07/24/2022    [provider]  botulinum toxin Type A (BOTOX) 200 units injection INJECT 200 UNITS INTO THE MUSCLES OF MULTIPLE SITES OF THE FACE AND NECK BY PHYSICIAN ONCE EVERY 3 MONTHS. Patient not taking: Reported on 07/24/2022 08/02/21     botulinum toxin Type A (BOTOX) 200 units injection INJECT 200 UNITS INTO THE MUSCLES OF MULTIPLE SITES OF THE  FACE AND NECK BY PHYSICIAN ONCE EVERY 3 MONTHS. Patient not taking: Reported on 07/24/2022 06/18/22     Botulinum Toxin Type A (BOTOX) 200 units SOLR INJECT 200 UNITS INTO THE MUSCLES OF MULTIPLE SITES OF THE FACE AND NECK BY PHYSICIAN ONCE EVERY 3 MONTHS. Patient not taking: Reported on 07/24/2022 01/16/21     clindamycin (CLEOCIN) 2 % vaginal cream Place 1 Applicatorful vaginally at bedtime. 04/22/23   Becky Augusta, NP  EPINEPHrine (EPIPEN 2-PAK) 0.3 mg/0.3 mL IJ SOAJ injection Inject intramuscularly in the event of an emergent allergic reaction with difficulty breathing. Patient not taking: Reported on 07/24/2022  04/15/21     FLUoxetine (PROZAC) 10 MG tablet Take 1 and 1/2 tablets by mouth once daily Patient not taking: Reported on 07/24/2022 03/22/21     FLUoxetine (PROZAC) 10 MG tablet Take 1 and 1/2 tablets by mouth once daily. Patient not taking: Reported on 02/26/2023 11/21/21     FLUoxetine (PROZAC) 20 MG tablet Take 1 tablet (20 mg total) by mouth daily. 02/27/23     furosemide (LASIX) 20 MG tablet Take 1 tablet (20 mg total) by mouth 2 (two)  times daily as needed. 06/07/21     furosemide (LASIX) 20 MG tablet Take 1 tablet (20 mg total) by mouth 2 (two) times daily as needed. Patient not taking: Reported on 02/26/2023 08/14/22     furosemide (LASIX) 20 MG tablet Take 1 tablet (20 mg total) by mouth 2 (two) times daily as needed. Patient not taking: Reported on 02/26/2023 08/14/22     hydrocortisone (ANUSOL-HC) 25 MG suppository Place rectally. Patient not taking: Reported on 07/24/2022 05/02/20   [provider]  hydrocortisone (ANUSOL-HC) 25 MG suppository Place 1 suppository (25 mg total) rectally 2 (two) times daily as needed for Hemorrhoids 11/21/21     ipratropium (ATROVENT) 0.06 % nasal spray Place 2 sprays into both nostrils 4 (four) times daily. Patient not taking: Reported on 02/26/2023 09/22/21   Becky Augusta, NP  levalbuterol [facility] HFA) 45 MCG/ACT inhaler Inhale 2 inhalations into the lungs every 6 (six) hours as needed for Wheezing 11/27/21     Magnesium Bisglycinate (MAG GLYCINATE) 100 MG TABS Take by mouth daily. Patient not taking: Reported on 02/26/2023    [provider]  methylPREDNISolone (MEDROL DOSEPAK) 4 MG TBPK tablet Use as directed. Patient not taking: Reported on 07/24/2022 07/03/22   Josph Macho, MD  Multiple Vitamins-Iron (MULTI-VITAMIN/IRON PO) Take by mouth daily.    [provider]  Omega-3 1000 MG CAPS Take by mouth daily.    [provider]  ondansetron (ZOFRAN-ODT) 8 MG disintegrating tablet Take 1 tablet (8 mg total) by mouth every 8  (eight) hours as needed for nausea or vomiting. 04/22/23   Becky Augusta, NP  ranitidine (ZANTAC) 75 MG tablet Take 75 mg by mouth as needed.     [provider]  Semaglutide-Weight Management (WEGOVY) 0.25 MG/0.5ML SOAJ Inject 1 mL (0.5 mg total) subcutaneously once a week Patient not taking: Reported on 07/24/2022 02/18/22     Semaglutide-Weight Management 0.25 MG/0.5ML SOAJ Inject 0.25 mg into the skin once weekly. Patient not taking: Reported on 07/24/2022 11/21/21     triamcinolone (NASACORT) 55 MCG/ACT AERO nasal inhaler Use 2 sprays in each nostril once daily Patient not taking: Reported on 07/24/2022 04/15/21     sucralfate (CARAFATE) 1 g tablet Take 1 tablet by mouth 4 (four) times daily. 09/30/17 11/07/20  [provider]  SYEDA 3-0.03 MG tablet Take 1 tablet  by mouth daily. 09/30/17 11/07/20  [provider]    Family History History reviewed. No pertinent family history.  Social History Social History   Tobacco Use   Smoking status: Never   Smokeless tobacco: Never  Vaping Use   Vaping status: Never Used  Substance Use Topics   Alcohol use: Not Currently    Comment: social   Drug use: No     Allergies   Omeprazole, Flagyl [metronidazole], Oxycodone-acetaminophen, Phentermine, and Sulfamethoxazole-trimethoprim   Review of Systems Review of Systems  Constitutional:  Negative for fever.  Gastrointestinal:  Positive for nausea. Negative for diarrhea and vomiting.  Psychiatric/Behavioral:  The patient is nervous/anxious.   All other systems reviewed and are negative.    Physical Exam Triage Vital Signs ED Triage Vitals  Encounter Vitals Group     BP 04/26/23 0941 126/84     Systolic BP Percentile --      Diastolic BP Percentile --      Pulse Rate 04/26/23 0941 82     Resp --      Temp 04/26/23 0941 98.7 F (37.1 C)     Temp Source 04/26/23 0941 Oral     SpO2 04/26/23 0941 98 %     Weight 04/26/23 0938 (!) 307 lb (139.3 kg)     Height  04/26/23 0938 5\' 9"  (1.753 m)     Head Circumference --      Peak Flow --      Pain Score 04/26/23 0938 0     Pain Loc --      Pain Education --      Exclude from Growth Chart --    No data found.  Updated Vital Signs BP 126/84 (BP Location: Left Arm)   Pulse 82   Temp 98.7 F (37.1 C) (Oral)   Ht 5\' 9"  (1.753 m)   Wt (!) 307 lb (139.3 kg)   LMP 02/11/2023 (Approximate) Comment: irregular periods and not on contraception  SpO2 98%   BMI 45.34 kg/m   Visual Acuity Right Eye Distance:   Left Eye Distance:   Bilateral Distance:    Right Eye Near:   Left Eye Near:    Bilateral Near:     Physical Exam Vitals and nursing note reviewed.  Constitutional:      General: She is not in acute distress.    Appearance: She is well-developed and well-groomed.  HENT:     Head: Normocephalic and atraumatic.  Eyes:     Conjunctiva/sclera: Conjunctivae normal.  Cardiovascular:     Rate and Rhythm: Normal rate and regular rhythm.     Pulses: Normal pulses.     Heart sounds: Normal heart sounds. No murmur heard. Pulmonary:     Effort: Pulmonary effort is normal. No respiratory distress.     Breath sounds: Normal breath sounds and air entry.  Abdominal:     Palpations: Abdomen is soft.     Tenderness: There is no abdominal tenderness.  Musculoskeletal:        General: No swelling.     Cervical back: Neck supple.  Skin:    General: Skin is warm and dry.     Capillary Refill: Capillary refill takes less than 2 seconds.  Neurological:     General: No focal deficit present.     Mental Status: She is alert and oriented to person, place, and time.     GCS: GCS eye subscore is 4. GCS verbal subscore is 5. GCS motor subscore is 6.  Psychiatric:        Attention and Perception: Attention normal.        Mood and Affect: Mood normal.        Speech: Speech normal.        Behavior: Behavior normal. Behavior is cooperative.      UC Treatments / Results  Labs (all labs ordered are  listed, but only abnormal results are displayed) Labs Reviewed - No data to display  EKG   Radiology No results found.  Procedures Procedures (including critical care time)  Medications Ordered in UC Medications - No data to display  Initial Impression / Assessment and Plan / UC Course  I have reviewed the triage vital signs and the nursing notes.  Pertinent labs & imaging results that were available during my care of the patient were reviewed by me and considered in my medical decision making (see chart for details).    Discussed exam findings and plan of care with patient, strict go to ER precautions given.   Patient verbalized understanding to this provider.  Ddx: Nausea, anxiety Final Clinical Impressions(s) / UC Diagnoses   Final diagnoses:  Nausea without vomiting     Discharge Instructions      Avoid spicy greasy fried foods, take Phenergan as prescribed may cause drowsiness.  Follow-up with your PCP, return as needed.     ED Prescriptions     Medication Sig Dispense Auth. Provider   promethazine (PHENERGAN) 12.5 MG tablet Take 1 tablet (12.5 mg total) by mouth every 6 (six) hours as needed for nausea or vomiting. 30 tablet Aliese Brannum, Para March, NP      PDMP not reviewed this encounter.   Clancy Gourd, NP 04/26/23 1024

## 2023-04-26 NOTE — ED Triage Notes (Signed)
Patient states she was seen a few days ago for fatigue and presyncope and had elevated white count.  Since then temps Tmax 99.7 in ear or oral, also w/ nausea.  Would like CBC re-check

## 2023-04-27 ENCOUNTER — Other Ambulatory Visit (HOSPITAL_BASED_OUTPATIENT_CLINIC_OR_DEPARTMENT_OTHER): Payer: Self-pay

## 2023-04-27 IMAGING — CT CT HEAD W/O CM
2 series · 15 of 30 positions shown, 17 images · non-contrast
Comparison: No pertinent prior exams available for comparison.

CLINICAL DATA: Intracranial shunt placement, follow-up; surgery
04/16/2021 for intracranial hypertension.

EXAM:
CT HEAD WITHOUT CONTRAST
TECHNIQUE: Contiguous axial images were obtained from the base of the skull
through the vertex without intravenous contrast.

[Series 2: head wo · axial · 0.43mm/px · z∈[-76,+34]mm · 7 of 30 slices shown, 9 images]
[im 4/30  brain]
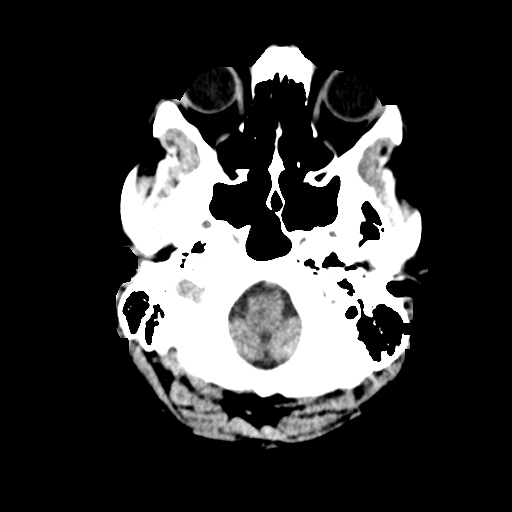
[im 4/30  bone]
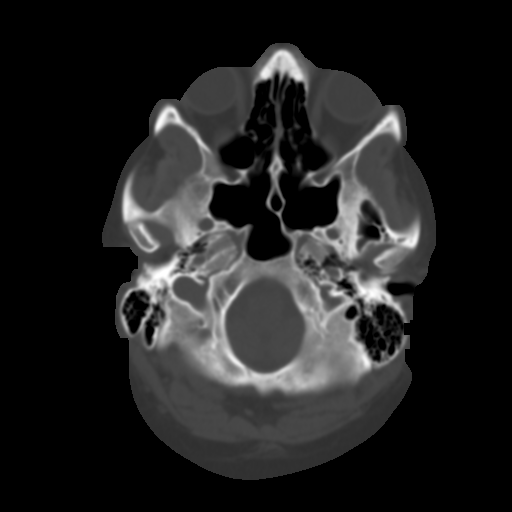
[im 8/30  brain]
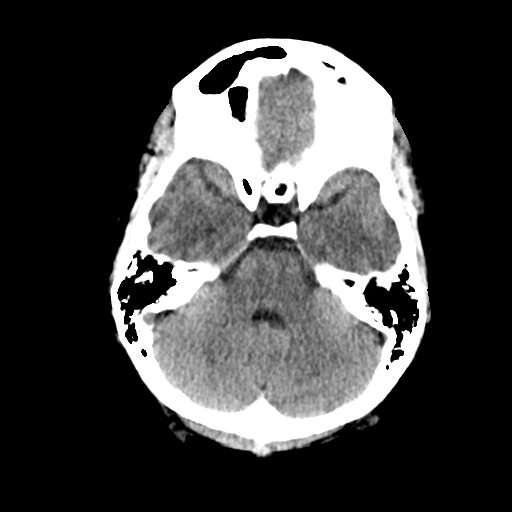
[im 11/30  brain]
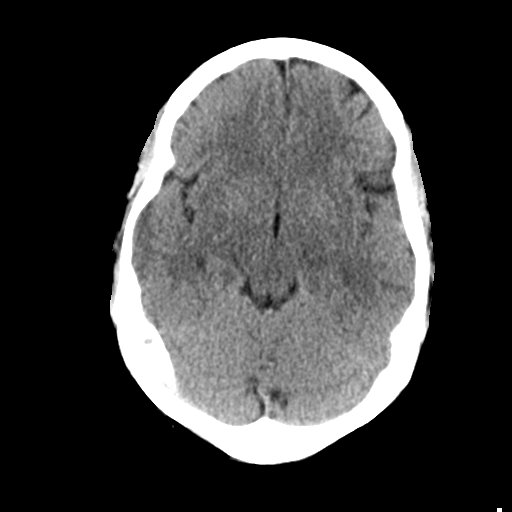
[im 15/30  brain]
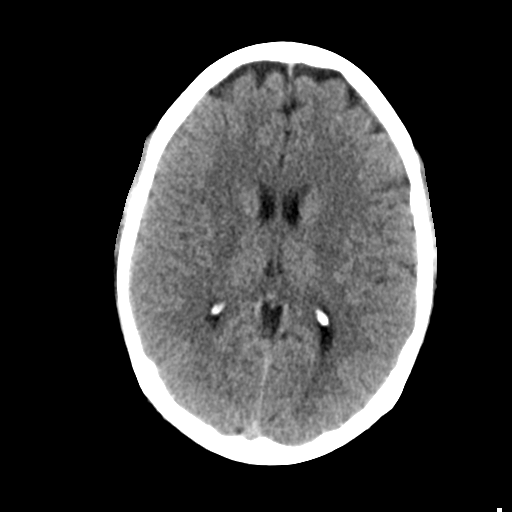
[im 19/30  brain]
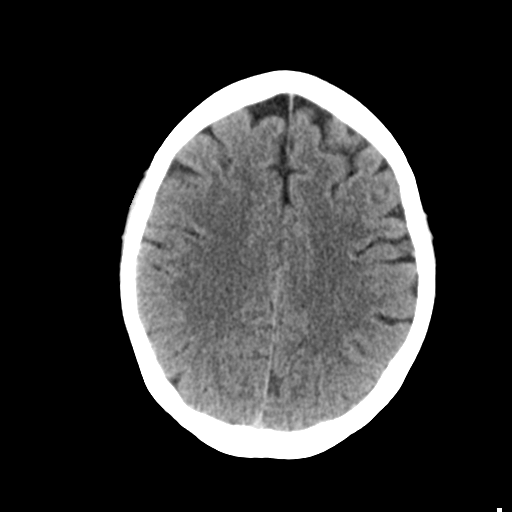
[im 19/30  bone]
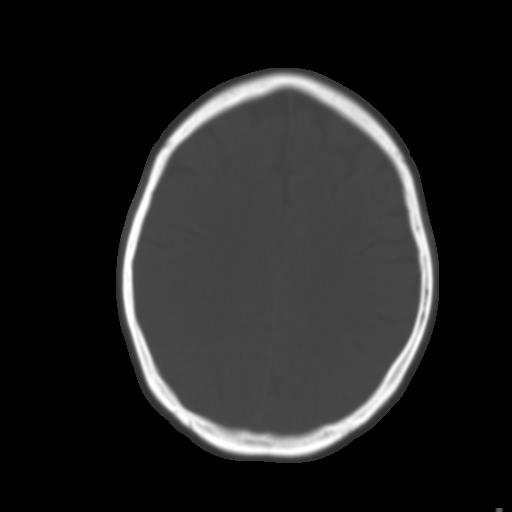
[im 22/30  brain]
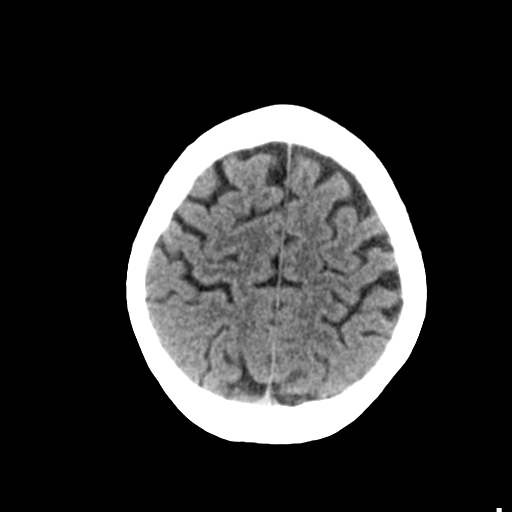
[im 26/30  brain]
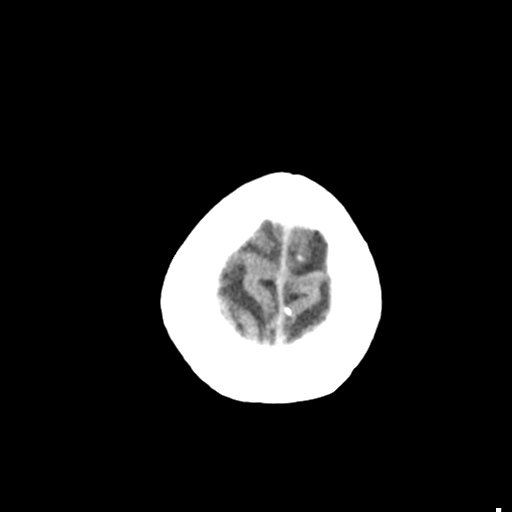

[Series 3: head bone · axial · 0.43mm/px · z∈[-77,+41]mm · 8 of 75 slices shown]
[im 8/75  bone]
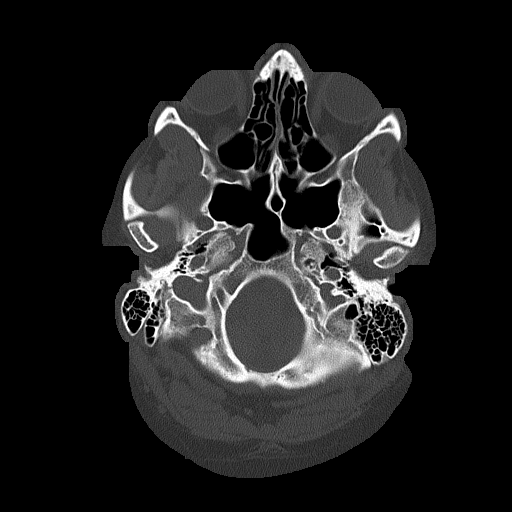
[im 15/75  bone]
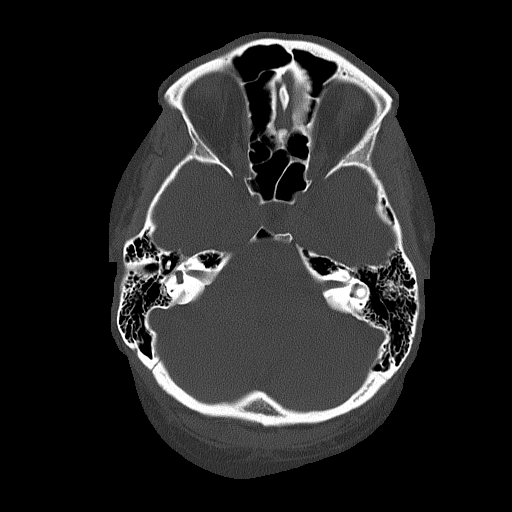
[im 23/75  bone]
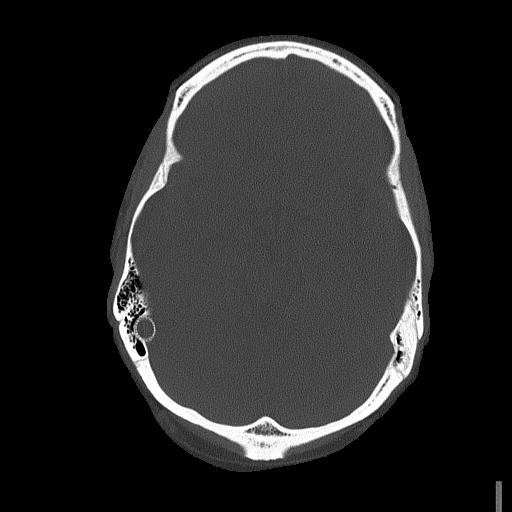
[im 34/75  bone]
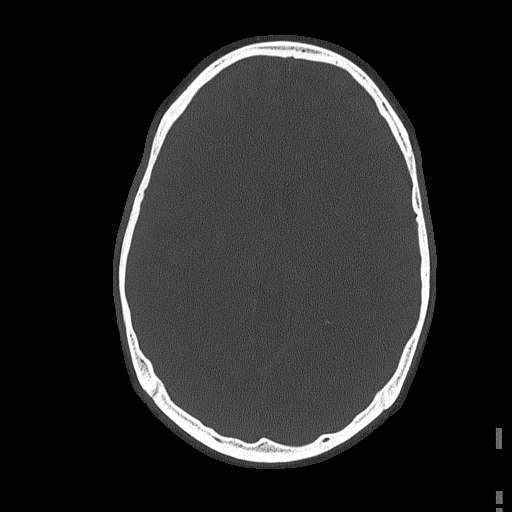
[im 41/75  bone]
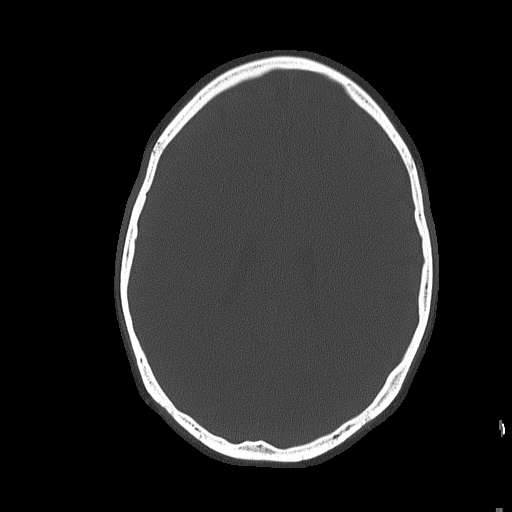
[im 52/75  bone]
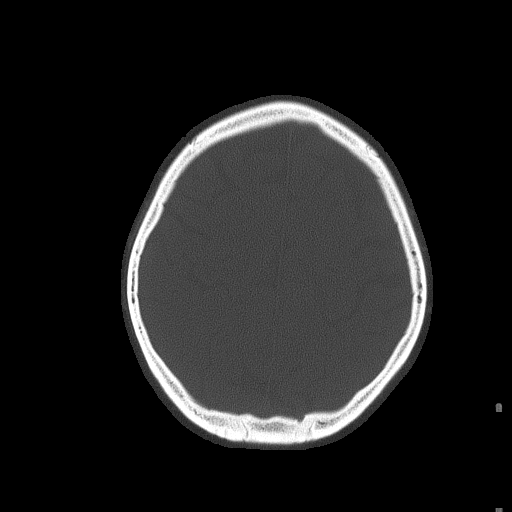
[im 60/75  bone]
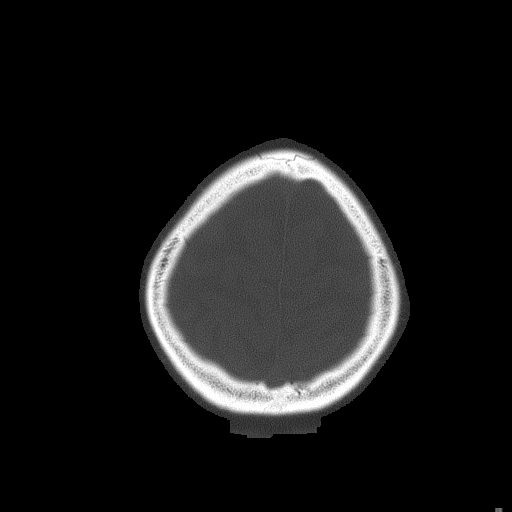
[im 67/75  bone]
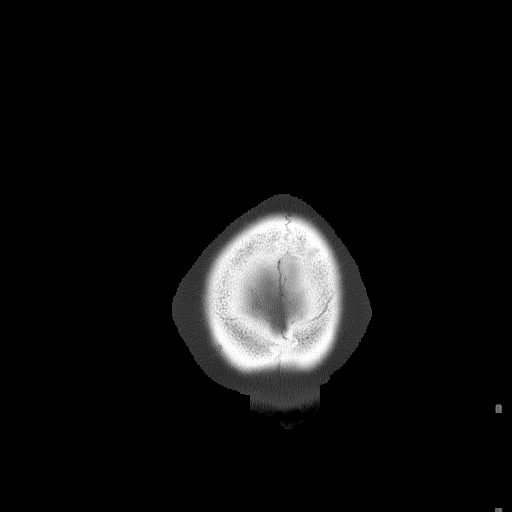

[15 of 30 positions shown; findings below may reference images not displayed]

FINDINGS: Brain:

Cerebral volume is normal.

There is no acute intracranial hemorrhage.

No demarcated cortical infarct.

No extra-axial fluid collection.

No evidence of an intracranial mass.

No midline shift.

Partially empty sella turcica.

Vascular: A vascular stent is present along the course of the right
transverse and sigmoid dural venous sinuses. Elsewhere, there is no
hyperdense vessel.

Skull: Normal. Negative for fracture or focal lesion.

Sinuses/Orbits: Visualized orbits show no acute finding. No
significant paranasal sinus disease at the imaged levels.
IMPRESSION: No evidence of acute intracranial abnormality.

A vascular stent is present along the course of the right transverse
and sigmoid dural venous sinuses. Partially empty sella turcica.
These findings are compatible with the provided history of
idiopathic intracranial hypertension.

## 2023-04-28 DIAGNOSIS — G932 Benign intracranial hypertension: Secondary | ICD-10-CM | POA: Diagnosis not present

## 2023-04-28 DIAGNOSIS — H52223 Regular astigmatism, bilateral: Secondary | ICD-10-CM | POA: Diagnosis not present

## 2023-04-30 ENCOUNTER — Other Ambulatory Visit (HOSPITAL_BASED_OUTPATIENT_CLINIC_OR_DEPARTMENT_OTHER): Payer: Self-pay

## 2023-04-30 DIAGNOSIS — G932 Benign intracranial hypertension: Secondary | ICD-10-CM | POA: Diagnosis not present

## 2023-04-30 DIAGNOSIS — G901 Familial dysautonomia [Riley-Day]: Secondary | ICD-10-CM | POA: Diagnosis not present

## 2023-04-30 DIAGNOSIS — G43719 Chronic migraine without aura, intractable, without status migrainosus: Secondary | ICD-10-CM | POA: Diagnosis not present

## 2023-04-30 DIAGNOSIS — Z1331 Encounter for screening for depression: Secondary | ICD-10-CM | POA: Diagnosis not present

## 2023-04-30 DIAGNOSIS — F411 Generalized anxiety disorder: Secondary | ICD-10-CM | POA: Diagnosis not present

## 2023-04-30 MED ORDER — FLUOXETINE HCL 20 MG PO TABS
30.0000 mg | ORAL_TABLET | Freq: Every day | ORAL | 4 refills | Status: DC
  Filled 2023-04-30 – 2023-06-08 (×2): qty 135, 90d supply, fill #0
  Filled 2023-09-21: qty 135, 90d supply, fill #1
  Filled 2024-01-28: qty 135, 90d supply, fill #2

## 2023-05-01 ENCOUNTER — Other Ambulatory Visit: Payer: 59

## 2023-05-13 DIAGNOSIS — Z85820 Personal history of malignant melanoma of skin: Secondary | ICD-10-CM | POA: Diagnosis not present

## 2023-05-13 DIAGNOSIS — Z08 Encounter for follow-up examination after completed treatment for malignant neoplasm: Secondary | ICD-10-CM | POA: Diagnosis not present

## 2023-05-13 DIAGNOSIS — L814 Other melanin hyperpigmentation: Secondary | ICD-10-CM | POA: Diagnosis not present

## 2023-05-13 DIAGNOSIS — D235 Other benign neoplasm of skin of trunk: Secondary | ICD-10-CM | POA: Diagnosis not present

## 2023-05-13 DIAGNOSIS — X32XXXA Exposure to sunlight, initial encounter: Secondary | ICD-10-CM | POA: Diagnosis not present

## 2023-05-18 ENCOUNTER — Encounter: Payer: Self-pay | Admitting: Internal Medicine

## 2023-05-18 ENCOUNTER — Ambulatory Visit: Payer: 59 | Attending: Internal Medicine | Admitting: Internal Medicine

## 2023-05-18 VITALS — BP 121/73 | HR 91 | Ht 69.0 in | Wt 312.6 lb

## 2023-05-18 DIAGNOSIS — R55 Syncope and collapse: Secondary | ICD-10-CM | POA: Diagnosis not present

## 2023-05-18 NOTE — Progress Notes (Signed)
Patient Care Team: Ethelda Chick, MD as PCP - General (Family Medicine)   HPI  Carol Porter is a 32 y.o. female seen in follow-up for symptoms consistent with dysautonomia, with orthostatic heat and exercise and shower and menses intolerance,  Also idiopathic intracranial hypertension with a history of transverse sinus stenting treated with Diamox  One interval episode presyncope.  Interestingly, it was evident upon awakening.  She had taken Lasix the day before for migraine headaches in the context of her intracranial hypertension.  Symptoms resolved over a couple of hours, to urgent care with ongoing modest symptoms her vital signs were normal.  Not exercising, likes to ride a Peloton bike but has hemorrhoids.  Salt intake moderate  Sleep apnea but machine has not been read in a long time.  By more than 10 years  She is doing counseling.  Records and Results Reviewed  Past Medical History:  Diagnosis Date   Allergy    Anxiety    Asthma    Depression    Idiopathic intracranial hypertension    Shingles     Past Surgical History:  Procedure Laterality Date   LUMBAR PUNCTURE      Current Meds  Medication Sig   Albuterol Sulfate (PROAIR RESPICLICK) 108 (90 Base) MCG/ACT AEPB Take by mouth.   FLUoxetine (PROZAC) 20 MG tablet Take 1.5 tablets (30 mg total) by mouth daily.   furosemide (LASIX) 20 MG tablet Take 1 tablet (20 mg total) by mouth 2 (two)  times daily as needed. (Patient taking differently: prn)   hydrocortisone (ANUSOL-HC) 25 MG suppository Place 1 suppository (25 mg total) rectally 2 (two) times daily as needed for Hemorrhoids (Patient taking differently: Place rectally. prn)   levalbuterol (XOPENEX HFA) 45 MCG/ACT inhaler Inhale 2 inhalations into the lungs every 6 (six) hours as needed for Wheezing (Patient taking differently: Inhale into the lungs. prn)   Magnesium Bisglycinate (MAG GLYCINATE) 100 MG TABS Take by mouth daily.   Multiple  Vitamins-Iron (MULTI-VITAMIN/IRON PO) Take by mouth daily.   promethazine (PHENERGAN) 12.5 MG tablet Take 1 tablet (12.5 mg total) by mouth every 6 (six) hours as needed for nausea or vomiting. (Patient taking differently: Take 12.5 mg by mouth every 6 (six) hours as needed for nausea or vomiting. prn)   triamcinolone (NASACORT) 55 MCG/ACT AERO nasal inhaler Use 2 sprays in each nostril once daily    Allergies  Allergen Reactions   Omeprazole Hives, Itching, Rash and Other (See Comments)   Flagyl [Metronidazole] Other (See Comments)    Tachycardia , dizziness , nausea and vomiting   Oxycodone-Acetaminophen Nausea And Vomiting   Phentermine     Fast heart rate and light headed   Sulfamethoxazole-Trimethoprim Nausea And Vomiting and Nausea Only      Review of Systems negative except from HPI and PMH  Physical Exam BP 121/73   Pulse 91   Ht 5\' 9"  (1.753 m)   Wt (!) 312 lb 9.6 oz (141.8 kg)   LMP 02/11/2023 (Approximate) Comment: irregular periods and not on contraception  SpO2 99%   BMI 46.16 kg/m  Well developed and Morbidly obese  in no acute distress HENT normal E scleral and icterus clear Neck Supple JVP flat; carotids brisk and full Clear to ausculation Regular rate and rhythm, no murmurs gallops or rub Soft with active bowel sounds No clubbing cyanosis  Edema Alert and oriented, grossly normal motor and sensory function Skin Warm and Dry  ECG sinus @  91 13/08/37 Lateral ST-T changes less impressive compared to 9/24   CrCl cannot be calculated (Patient's most recent lab result is older than the maximum 21 days allowed.).   Assessment and  Plan  RSR prime without evidence of right bundle blanch block or ASD   Dysautonomia with orthostatic intolerance   Idiopathic intracranial hypertension (no reports that I can find other associated with dysautonomia)   PCOS   Anxiety/depression   Obesity and intolerant of Wegovy  Treated sleep apnea question  adequacy  RSR prime stable.  Expressed concern about the presence of the RSR prime.  Interestingly, there was a comment in up-to-date that incomplete right bundle branch block seems to diminish with age, somewhat surprising to me.  Orthostatic symptoms are largely quiescent.  Indeed she has orthostatic hypertension today.  Will have her cut back on her salt.  In the context of her PCOS unfortunately not a big surprise  Anxiety/depression is better.  She is undertaking counseling  Her sleep apnea therapy has not been assessed in some time.  Have recommended that she do that.  Current medicines are reviewed at length with the patient today .  The patient does not  have concerns regarding medicines.

## 2023-05-18 NOTE — Patient Instructions (Signed)
Follow up with your primary care doctor regarding your sleep apnea  Exercise  Medication Instructions:  No changes at this time.   *If you need a refill on your cardiac medications before your next appointment, please call your pharmacy*   Lab Work: None  If you have labs (blood work) drawn today and your tests are completely normal, you will receive your results only by: MyChart Message (if you have MyChart) OR A paper copy in the mail If you have any lab test that is abnormal or we need to change your treatment, we will call you to review the results.   Testing/Procedures: None   Follow-Up: At [facility], you and your health needs are our priority.  As part of our continuing mission to provide you with exceptional heart care, we have created designated Provider Care Teams.  These Care Teams include your primary Cardiologist (physician) and Advanced Practice Providers (APPs -  Physician Assistants and Nurse Practitioners) who all work together to provide you with the care you need, when you need it.    Your next appointment:   1 year(s)  Provider:   Sherryl Manges, MD

## 2023-05-19 ENCOUNTER — Other Ambulatory Visit (HOSPITAL_BASED_OUTPATIENT_CLINIC_OR_DEPARTMENT_OTHER): Payer: Self-pay

## 2023-05-19 DIAGNOSIS — R569 Unspecified convulsions: Secondary | ICD-10-CM | POA: Diagnosis not present

## 2023-05-19 DIAGNOSIS — G43719 Chronic migraine without aura, intractable, without status migrainosus: Secondary | ICD-10-CM | POA: Diagnosis not present

## 2023-05-19 MED ORDER — LAMOTRIGINE 25 MG PO TABS
ORAL_TABLET | ORAL | 0 refills | Status: DC
  Filled 2023-05-19: qty 42, 28d supply, fill #0

## 2023-05-19 MED ORDER — LAMOTRIGINE 25 MG PO TABS
50.0000 mg | ORAL_TABLET | Freq: Two times a day (BID) | ORAL | 3 refills | Status: DC
  Filled 2023-05-19: qty 120, 30d supply, fill #0

## 2023-05-27 DIAGNOSIS — G4733 Obstructive sleep apnea (adult) (pediatric): Secondary | ICD-10-CM | POA: Diagnosis not present

## 2023-05-29 ENCOUNTER — Ambulatory Visit
Admission: RE | Admit: 2023-05-29 | Discharge: 2023-05-29 | Disposition: A | Discharge status: Home / Self Care | Payer: 59 | Source: Ambulatory Visit | Attending: General Surgery | Admitting: General Surgery

## 2023-05-29 ENCOUNTER — Other Ambulatory Visit (HOSPITAL_BASED_OUTPATIENT_CLINIC_OR_DEPARTMENT_OTHER): Payer: Self-pay

## 2023-05-29 DIAGNOSIS — N6322 Unspecified lump in the left breast, upper inner quadrant: Secondary | ICD-10-CM

## 2023-05-29 DIAGNOSIS — R92323 Mammographic fibroglandular density, bilateral breasts: Secondary | ICD-10-CM | POA: Diagnosis not present

## 2023-05-29 DIAGNOSIS — R928 Other abnormal and inconclusive findings on diagnostic imaging of breast: Secondary | ICD-10-CM

## 2023-05-29 DIAGNOSIS — N6320 Unspecified lump in the left breast, unspecified quadrant: Secondary | ICD-10-CM | POA: Diagnosis not present

## 2023-06-03 ENCOUNTER — Other Ambulatory Visit (HOSPITAL_BASED_OUTPATIENT_CLINIC_OR_DEPARTMENT_OTHER): Payer: Self-pay | Admitting: Neurology

## 2023-06-03 DIAGNOSIS — R569 Unspecified convulsions: Secondary | ICD-10-CM

## 2023-06-03 DIAGNOSIS — G43719 Chronic migraine without aura, intractable, without status migrainosus: Secondary | ICD-10-CM

## 2023-06-08 ENCOUNTER — Other Ambulatory Visit (HOSPITAL_BASED_OUTPATIENT_CLINIC_OR_DEPARTMENT_OTHER): Payer: Self-pay

## 2023-06-09 ENCOUNTER — Telehealth (HOSPITAL_BASED_OUTPATIENT_CLINIC_OR_DEPARTMENT_OTHER): Payer: Self-pay | Admitting: Neurology

## 2023-06-10 ENCOUNTER — Ambulatory Visit (HOSPITAL_BASED_OUTPATIENT_CLINIC_OR_DEPARTMENT_OTHER): Admission: RE | Admit: 2023-06-10 | Payer: 59 | Source: Ambulatory Visit

## 2023-06-17 ENCOUNTER — Other Ambulatory Visit (HOSPITAL_BASED_OUTPATIENT_CLINIC_OR_DEPARTMENT_OTHER): Payer: Self-pay

## 2023-06-18 ENCOUNTER — Other Ambulatory Visit (HOSPITAL_BASED_OUTPATIENT_CLINIC_OR_DEPARTMENT_OTHER): Payer: Self-pay

## 2023-06-18 MED ORDER — HYDROCORTISONE ACETATE 25 MG RE SUPP
25.0000 mg | Freq: Two times a day (BID) | RECTAL | 3 refills | Status: AC
Start: 2023-06-18 — End: ?
  Filled 2023-06-18: qty 20, 10d supply, fill #0
  Filled 2023-07-20: qty 20, 10d supply, fill #1
  Filled 2024-01-28: qty 20, 10d supply, fill #2

## 2023-06-18 MED ORDER — LEVALBUTEROL TARTRATE 45 MCG/ACT IN AERO
2.0000 | INHALATION_SPRAY | Freq: Four times a day (QID) | RESPIRATORY_TRACT | 2 refills | Status: AC | PRN
  Filled 2023-06-18: qty 15, 25d supply, fill #0
  Filled 2023-07-20: qty 15, 25d supply, fill #1

## 2023-07-10 DIAGNOSIS — Z6841 Body Mass Index (BMI) 40.0 and over, adult: Secondary | ICD-10-CM | POA: Diagnosis not present

## 2023-07-10 DIAGNOSIS — Z713 Dietary counseling and surveillance: Secondary | ICD-10-CM | POA: Diagnosis not present

## 2023-07-10 DIAGNOSIS — E66813 Obesity, class 3: Secondary | ICD-10-CM | POA: Diagnosis not present

## 2023-07-20 ENCOUNTER — Other Ambulatory Visit (HOSPITAL_BASED_OUTPATIENT_CLINIC_OR_DEPARTMENT_OTHER): Payer: Self-pay

## 2023-07-21 ENCOUNTER — Other Ambulatory Visit (HOSPITAL_BASED_OUTPATIENT_CLINIC_OR_DEPARTMENT_OTHER): Payer: Self-pay

## 2023-08-07 DIAGNOSIS — Z6841 Body Mass Index (BMI) 40.0 and over, adult: Secondary | ICD-10-CM | POA: Diagnosis not present

## 2023-08-07 DIAGNOSIS — Z713 Dietary counseling and surveillance: Secondary | ICD-10-CM | POA: Diagnosis not present

## 2023-08-07 DIAGNOSIS — E66813 Obesity, class 3: Secondary | ICD-10-CM | POA: Diagnosis not present

## 2023-08-31 ENCOUNTER — Other Ambulatory Visit (HOSPITAL_BASED_OUTPATIENT_CLINIC_OR_DEPARTMENT_OTHER): Payer: Self-pay

## 2023-08-31 DIAGNOSIS — E282 Polycystic ovarian syndrome: Secondary | ICD-10-CM | POA: Diagnosis not present

## 2023-08-31 DIAGNOSIS — L680 Hirsutism: Secondary | ICD-10-CM | POA: Diagnosis not present

## 2023-08-31 DIAGNOSIS — R55 Syncope and collapse: Secondary | ICD-10-CM | POA: Diagnosis not present

## 2023-08-31 DIAGNOSIS — G932 Benign intracranial hypertension: Secondary | ICD-10-CM | POA: Diagnosis not present

## 2023-08-31 MED ORDER — METFORMIN HCL ER 500 MG PO TB24
1000.0000 mg | ORAL_TABLET | Freq: Every day | ORAL | 11 refills | Status: DC
  Filled 2023-08-31 – 2023-09-07 (×2): qty 60, 30d supply, fill #0

## 2023-09-01 DIAGNOSIS — L680 Hirsutism: Secondary | ICD-10-CM | POA: Diagnosis not present

## 2023-09-01 IMAGING — CR DG CHEST 2V
2 series · 2 of 2 positions shown · non-contrast
Comparison: 11/07/2020

CLINICAL DATA: Persistent cough with crackles

EXAM:
CHEST - 2 VIEW

[chest pa]
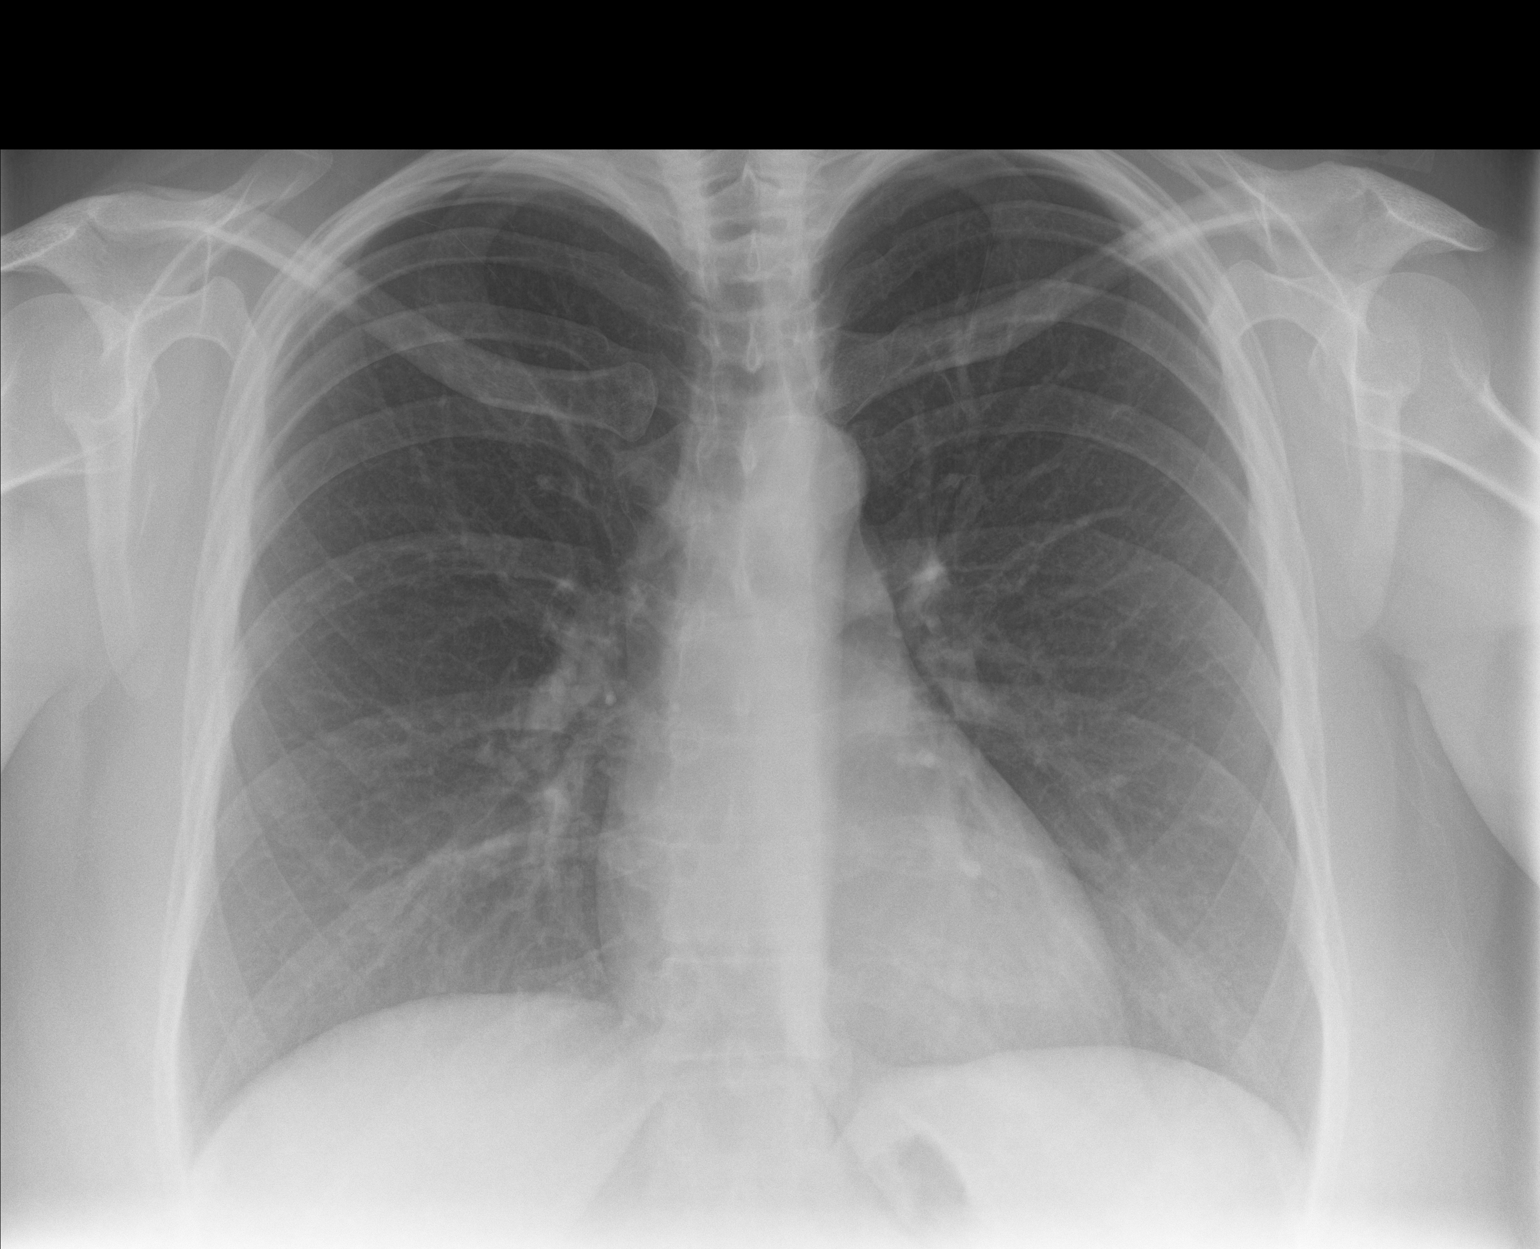

[chest lat]
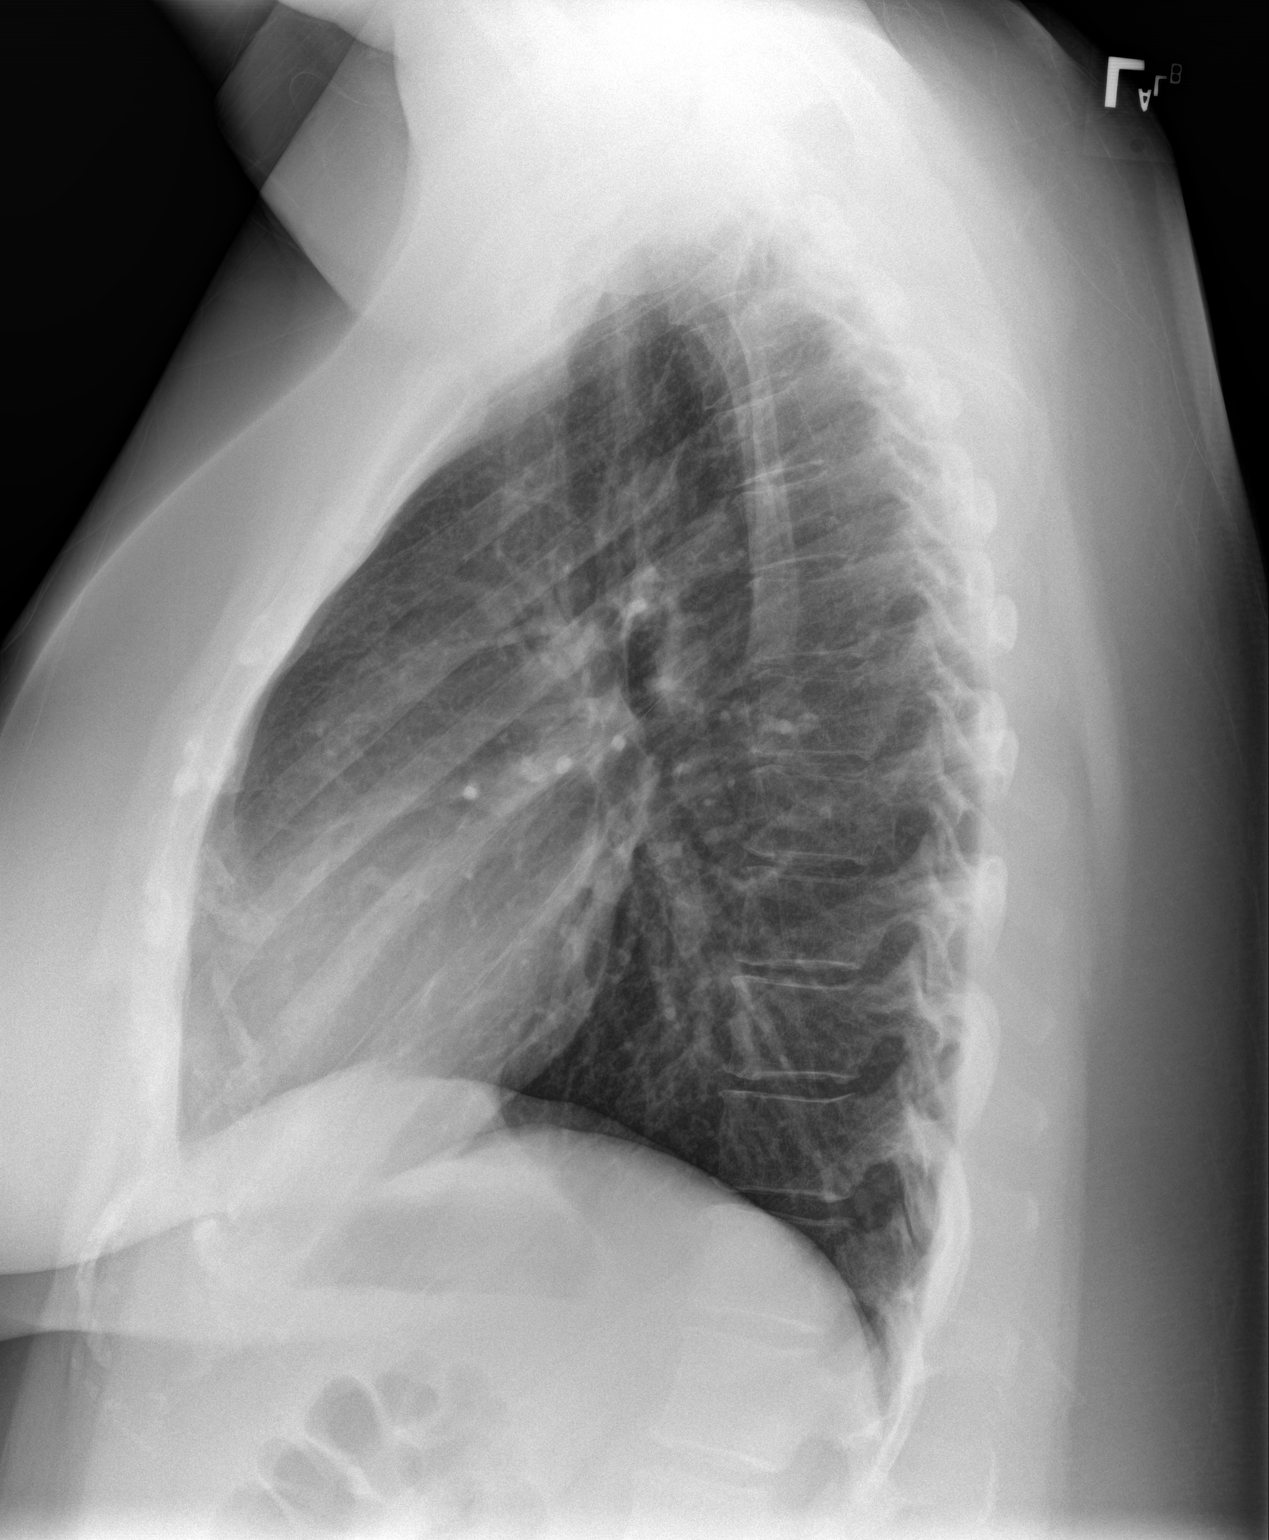

[2 of 2 positions shown; findings below may reference images not displayed]

FINDINGS: The heart size and mediastinal contours are within normal limits.
Both lungs are clear. The visualized skeletal structures are
unremarkable.
IMPRESSION: No active cardiopulmonary disease.

## 2023-09-07 ENCOUNTER — Other Ambulatory Visit (HOSPITAL_BASED_OUTPATIENT_CLINIC_OR_DEPARTMENT_OTHER): Payer: Self-pay

## 2023-09-09 DIAGNOSIS — G4733 Obstructive sleep apnea (adult) (pediatric): Secondary | ICD-10-CM | POA: Diagnosis not present

## 2023-09-21 ENCOUNTER — Other Ambulatory Visit (HOSPITAL_BASED_OUTPATIENT_CLINIC_OR_DEPARTMENT_OTHER): Payer: Self-pay

## 2023-09-25 DIAGNOSIS — E66813 Obesity, class 3: Secondary | ICD-10-CM | POA: Diagnosis not present

## 2023-09-25 DIAGNOSIS — Z713 Dietary counseling and surveillance: Secondary | ICD-10-CM | POA: Diagnosis not present

## 2023-09-25 DIAGNOSIS — Z6841 Body Mass Index (BMI) 40.0 and over, adult: Secondary | ICD-10-CM | POA: Diagnosis not present

## 2023-09-25 DIAGNOSIS — E282 Polycystic ovarian syndrome: Secondary | ICD-10-CM | POA: Diagnosis not present

## 2023-09-28 DIAGNOSIS — R7989 Other specified abnormal findings of blood chemistry: Secondary | ICD-10-CM | POA: Diagnosis not present

## 2023-10-13 ENCOUNTER — Other Ambulatory Visit (HOSPITAL_BASED_OUTPATIENT_CLINIC_OR_DEPARTMENT_OTHER): Payer: Self-pay

## 2023-10-13 MED ORDER — DEXAMETHASONE 1 MG PO TABS
ORAL_TABLET | ORAL | 0 refills | Status: DC
  Filled 2023-10-13: qty 1, 1d supply, fill #0

## 2023-11-09 DIAGNOSIS — Z6841 Body Mass Index (BMI) 40.0 and over, adult: Secondary | ICD-10-CM | POA: Diagnosis not present

## 2023-11-09 DIAGNOSIS — Z713 Dietary counseling and surveillance: Secondary | ICD-10-CM | POA: Diagnosis not present

## 2023-11-09 DIAGNOSIS — E66813 Obesity, class 3: Secondary | ICD-10-CM | POA: Diagnosis not present

## 2023-11-10 ENCOUNTER — Other Ambulatory Visit (HOSPITAL_COMMUNITY)
Admission: RE | Admit: 2023-11-10 | Discharge: 2023-11-10 | Disposition: A | Discharge status: Home / Self Care | Source: Ambulatory Visit | Attending: Obstetrics and Gynecology | Admitting: Obstetrics and Gynecology

## 2023-11-10 ENCOUNTER — Ambulatory Visit

## 2023-11-10 VITALS — BP 138/61 | HR 92 | Wt 329.0 lb

## 2023-11-10 DIAGNOSIS — N898 Other specified noninflammatory disorders of vagina: Secondary | ICD-10-CM | POA: Diagnosis not present

## 2023-11-10 NOTE — Progress Notes (Signed)
 SUBJECTIVE:  33 y.o. female complains of clear vaginal discharge for 2  day(s). Denies abnormal vaginal bleeding or significant pelvic pain or fever. No UTI symptoms. Denies history of known exposure to STD.  Patient's last menstrual period was 10/09/2023 (exact date).  OBJECTIVE:  She appears well, afebrile. Urine dipstick: not done.  ASSESSMENT:  Vaginal Discharge  Vaginal Odor   PLAN:  GC, chlamydia, trichomonas, BVAG, CVAG probe sent to lab. Treatment: To be determined once lab results are received ROV prn if symptoms persist or worsen.   Nadia Viar l Yee Joss, CMA

## 2023-11-12 LAB — CERVICOVAGINAL ANCILLARY ONLY
Bacterial Vaginitis (gardnerella): NEGATIVE
Candida Glabrata: NEGATIVE
Candida Vaginitis: NEGATIVE
Chlamydia: NEGATIVE
Comment: NEGATIVE
Comment: NEGATIVE
Comment: NEGATIVE
Comment: NEGATIVE
Comment: NEGATIVE
Comment: NORMAL
Neisseria Gonorrhea: NEGATIVE
Trichomonas: NEGATIVE

## 2023-11-13 DIAGNOSIS — R7989 Other specified abnormal findings of blood chemistry: Secondary | ICD-10-CM | POA: Diagnosis not present

## 2023-11-20 ENCOUNTER — Other Ambulatory Visit (HOSPITAL_BASED_OUTPATIENT_CLINIC_OR_DEPARTMENT_OTHER): Payer: Self-pay

## 2023-11-20 MED ORDER — DEXAMETHASONE 1 MG PO TABS
1.0000 mg | ORAL_TABLET | ORAL | 0 refills | Status: DC
  Filled 2023-11-20: qty 1, 1d supply, fill #0

## 2023-11-24 ENCOUNTER — Other Ambulatory Visit (HOSPITAL_BASED_OUTPATIENT_CLINIC_OR_DEPARTMENT_OTHER): Payer: Self-pay

## 2023-11-30 ENCOUNTER — Ambulatory Visit: Payer: Commercial Managed Care - PPO | Admitting: Dermatology

## 2023-12-02 ENCOUNTER — Ambulatory Visit: Admitting: Internal Medicine

## 2023-12-03 NOTE — Telephone Encounter (Unsigned)
 Copied from CRM [id number]. 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 Geralyn Knee accepts her first

## 2023-12-07 ENCOUNTER — Telehealth: Payer: Self-pay | Admitting: Internal Medicine

## 2023-12-07 ENCOUNTER — Ambulatory Visit (INDEPENDENT_AMBULATORY_CARE_PROVIDER_SITE_OTHER): Admitting: Internal Medicine

## 2023-12-07 VITALS — BP 122/72 | HR 87 | Temp 97.9°F | Resp 18 | Ht 69.0 in | Wt 325.2 lb

## 2023-12-07 DIAGNOSIS — T782XXA Anaphylactic shock, unspecified, initial encounter: Secondary | ICD-10-CM

## 2023-12-07 DIAGNOSIS — T782XXD Anaphylactic shock, unspecified, subsequent encounter: Secondary | ICD-10-CM | POA: Diagnosis not present

## 2023-12-07 DIAGNOSIS — J4520 Mild intermittent asthma, uncomplicated: Secondary | ICD-10-CM

## 2023-12-07 DIAGNOSIS — J3089 Other allergic rhinitis: Secondary | ICD-10-CM

## 2023-12-07 DIAGNOSIS — Z8719 Personal history of other diseases of the digestive system: Secondary | ICD-10-CM

## 2023-12-07 DIAGNOSIS — L2084 Intrinsic (allergic) eczema: Secondary | ICD-10-CM | POA: Diagnosis not present

## 2023-12-07 NOTE — Progress Notes (Unsigned)
 NEW PATIENT Date of Service/Encounter:  12/08/23 Referring provider: Valere Gata, MD Primary care provider: Valere Gata, MD  Subjective:  Carol Porter is a 33 y.o. female  presenting today for evaluation of shellfish allergy allergic rhinitis, asthma, eczema, chronic diarrhea History obtained from: chart review and patient.   Discussed the use of AI scribe software for clinical note transcription with the patient, who gave verbal consent to proceed.  History of Present Illness Carol Porter is a 33 year old female who presents for evaluation of potential shellfish allergy.  In 2019, she experienced her first reaction to seafood after consuming a shrimp cocktail, which resulted in a bump above her lip, facial flushing, and diarrhea within 30 minutes. She has a history of IBS. Despite negative shellfish allergy testing last year, she has not reintroduced shellfish into her diet.  Recently, she consumed a sushi boat with various fish at a [employer], avoiding eel and scallops. She developed tiny red speckles on her face, facial flushing, followed by a panic attack and diarrhea. She has an EpiPen  at home but has not used it. She experiences inconsistent reactions to foods fried in the same fryer as shrimp, sometimes resulting in diarrhea.  She tracks her migraines and notes a correlation with fish consumption, experiencing migraines the day after eating fish. She has seasonal allergies, particularly in the fall, with symptoms of congestion. She uses triamcinolone  nasal spray but experiences breakthrough symptoms. She previously used Claritin or Zyrtec but stopped due to severe withdrawal symptoms, including itching, headaches, and gastrointestinal issues.  She has a history of asthma, which is well-controlled unless she contracts a virus, and uses Xopenex  due to palpitations with albuterol. She has not been hospitalized for asthma and has not required steroids in the past  year. She experiences eczema primarily in the winter, treated with topical hydrocortisone .  She reports a contact reaction to lobster and shrimp shells, causing severe itchiness and redness on her skin, but no ingestion issues in the past. No sinus congestion, itchy ears, red eyes, nausea, menstrual cramps, cough, mouth itching, random hives, or swelling.  She is hesitant of taking antihistamines as she reports a withdrawal syndrome when she last took him.  She had diffuse diarrhea which lasted for 2 weeks as well as itching when she stopped the medications.      Other allergy screening: Asthma: no Rhino conjunctivitis: yes Food allergy: yes Medication allergy: yes Hymenoptera allergy: no Urticaria: no Eczema:yes History of recurrent infections suggestive of immunodeficency: no Vaccinations are up to date.   Past Medical History: Past Medical History:  Diagnosis Date   Allergy    Anxiety    Asthma    Depression    Idiopathic intracranial hypertension    Shingles    Medication List:  Current Outpatient Medications  Medication Sig Dispense Refill   famotidine (PEPCID) 20 MG tablet Take by mouth.     FLUoxetine  (PROZAC ) 20 MG tablet Take 1.5 tablets (30 mg total) by mouth daily. 135 tablet 4   hydrocortisone  (ANUSOL -HC) 25 MG suppository Place 1 suppository (25 mg total) rectally 2 (two) times daily as needed for Hemorrhoids (Patient taking differently: Place rectally. prn) 20 suppository 3   hydrocortisone  (ANUSOL -HC) 25 MG suppository Place 1 suppository (25 mg total) rectally 2 (two) times daily as needed for Hemorrhoids 20 suppository 3   levalbuterol  (XOPENEX  HFA) 45 MCG/ACT inhaler Inhale 2 inhalations into the lungs every 6 (six) hours as needed for Wheezing (Patient taking differently: Inhale  into the lungs. prn) 15 g 1   levalbuterol  (XOPENEX  HFA) 45 MCG/ACT inhaler Inhale 2 inhalations into the lungs every 6 (six) hours as needed for Wheezing 15 g 2   Magnesium  Bisglycinate (MAG GLYCINATE) 100 MG TABS Take by mouth daily.     promethazine  (PHENERGAN ) 12.5 MG tablet Take 1 tablet (12.5 mg total) by mouth every 6 (six) hours as needed for nausea or vomiting. (Patient taking differently: Take 12.5 mg by mouth every 6 (six) hours as needed for nausea or vomiting. prn) 30 tablet 0   triamcinolone  (NASACORT ) 55 MCG/ACT AERO nasal inhaler Use 2 sprays in each nostril once daily 16.9 mL 12   Albuterol Sulfate (PROAIR RESPICLICK) 108 (90 Base) MCG/ACT AEPB Take by mouth. (Patient not taking: Reported on 12/07/2023)     dexamethasone  (DECADRON ) 1 MG tablet Take 1 tablet (1 mg total) by mouth in the  evening at 10-11 pm the night prior to cortisol lab. (Patient not taking: Reported on 12/07/2023) 1 tablet 0   furosemide  (LASIX ) 20 MG tablet Take 1 tablet (20 mg total) by mouth 2 (two)  times daily as needed. (Patient not taking: Reported on 12/07/2023) 60 tablet 1   lamoTRIgine  (LAMICTAL ) 25 MG tablet Take 2 tablets (50 mg total) by mouth every 12 (twelve) hours. (Patient not taking: Reported on 12/07/2023) 120 tablet 3   lamoTRIgine  (LAMICTAL ) 25 MG tablet Take 1 tablet (25 mg total) by mouth at bedtime for 14 days, THEN 2 tablets (50 mg total) at bedtime for 14 days. 42 tablet 0   metFORMIN  (GLUCOPHAGE -XR) 500 MG 24 hr tablet Take 2 tablets (1,000 mg total) by mouth daily with dinner. (Patient not taking: Reported on 12/07/2023) 60 tablet 11   Multiple Vitamins-Iron (MULTI-VITAMIN/IRON PO) Take by mouth daily.     No current facility-administered medications for this visit.   Known Allergies:  Allergies  Allergen Reactions   Omeprazole Hives, Itching, Rash and Other (See Comments)   Flagyl [Metronidazole] Other (See Comments)    Tachycardia , dizziness , nausea and vomiting   Oxycodone-Acetaminophen  Nausea And Vomiting   Phentermine     Fast heart rate and light headed   Sulfamethoxazole-Trimethoprim Nausea And Vomiting and Nausea Only   Past Surgical  History: Past Surgical History:  Procedure Laterality Date   LUMBAR PUNCTURE     Family History: No family history on file. Social History: Fujie lives single-family home is built in 1996.  Carpet throughout, gas heating and central cooling.  To doodles with access to bedroom.  No roaches in the house and best to get off the floor.  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  There are horses and cows outside the house.  No tobacco exposure..   ROS:  All other systems negative except as noted per HPI.  Objective:  Blood pressure 122/72, pulse 87, temperature 97.9 F (36.6 C), temperature source Temporal, resp. rate 18, height 5\' 9"  (1.753 m), weight (!) 325 lb 3.2 oz (147.5 kg), last menstrual period 10/09/2023, SpO2 99%. Body mass index is 48.02 kg/m. Physical Exam:  General Appearance:  Alert, cooperative, no distress, appears stated age  Head:  Normocephalic, without obvious abnormality, atraumatic  Eyes:  Conjunctiva clear, EOM's intact  Ears EACs normal bilaterally and normal TMs bilaterally  Nose: Nares normal, erythematous and edematous nasal mucosa, no rhinorrhea, hypertrophic turbinates, no visible anterior polyps, and septum midline  Throat: Lips, tongue normal; teeth  and gums normal, + cobblestoning  Neck: Supple, symmetrical  Lungs:   clear to auscultation bilaterally, Respirations unlabored, no coughing  Heart:  regular rate and rhythm and no murmur, Appears well perfused  Extremities: No edema  Skin: Skin color, texture, turgor normal and no rashes or lesions on visualized portions of skin  Neurologic: No gross deficits   Diagnostics: None done   Labs:  Lab Orders         Tryptase       Assessment and Plan  Assessment and Plan Assessment & Plan Seasonal allergic rhinitis Breakthrough symptoms despite triamcinolone . Previous antihistamine use caused severe withdrawal symptoms. - Continue  triamcinolone  nasal spray as needed  - Consider cautious reintroduction of oral antihistamines if symptoms persist.  Shellfish allergy Suspected allergy with negative blood test. Differential includes IBS or food intolerance. Discussed unpredictability and potential severity of reactions. - Schedule allergy testing for shellfish and mollusks. - Avoid shellfish and mollusks until testing is complete. - Consider food challenge based on test results and risk tolerance. - Discussed cross-reaction rates: shellfish and mollusks 70-80%, shellfish to mollusk 30%. - okay to introduce fin fish  - no concern about cross reaction with Iodine contrast   Asthma, mild intermittent  Well-controlled with levalbuterol , except during viral infections. No recent hospitalizations or steroid use. - Continue levalbuterol   2 puffs every 4 hours as needed. - Monitor for exacerbations during viral infections.  Eczema Occurs primarily in winter, managed with topical hydrocortisone . - Continue topical hydrocortisone  twice daily as needed - Recommend hypoallergenic hydrating ointment at least twice daily.  This must be done daily for control of flares. (Great options include Vaseline, CeraVe, Aquaphor, Aveeno, Cetaphil, VaniCream, etc) - Recommend avoiding detergents, soaps or lotions with fragrances/dyes, and instead using products which are hypoallergenic, use second rinse cycle when washing clothes -Wear lose breathable clothing, avoid wool -Avoid extremes of humidity - Limit showers/baths to 5 minutes and use luke warm water instead of hot, pat dry following baths, and apply moisturizer  Diarrhea  Intermittent diarrhea possibly linked to food triggers. Differential includes food intolerance, IBS vs MCAS - Monitor symptoms and dietary triggers. - Consider avoidance of suspected trigger foods until further testing. - Will get screening tryptase   Follow up: for skin testing.  HOLD Pepcid and all other  anthistamines for 3 days prior   This note in its entirety was forwarded to the Provider who requested this consultation.  Other:    Thank you for your kind referral. I appreciate the opportunity to take part in Shonda's care. Please do not hesitate to contact me with questions.  Sincerely,  Thank you so much for letting me partake in your care today.  Don't hesitate to reach out if you have any additional concerns!  Orelia Binet, MD  [facility] [place], [place]

## 2023-12-07 NOTE — Telephone Encounter (Signed)
 Sent MyChart message to pt advising of provider info

## 2023-12-07 NOTE — Patient Instructions (Signed)
 Seasonal allergic rhinitis Breakthrough symptoms despite triamcinolone . Previous antihistamine use caused severe withdrawal symptoms. - Continue triamcinolone  nasal spray as needed  - Consider cautious reintroduction of oral antihistamines if symptoms persist.  Shellfish allergy Suspected allergy with negative blood test. Differential includes IBS or food intolerance. Discussed unpredictability and potential severity of reactions. - Schedule allergy testing for shellfish and mollusks. - Avoid shellfish and mollusks until testing is complete. - Consider food challenge based on test results and risk tolerance. - Discussed cross-reaction rates: shellfish and mollusks 70-80%, shellfish to mollusk 30%. - okay to introduce fin fish  - no concern about cross reaction with Iodine contrast   Asthma, mild intermittent  Well-controlled with levalbuterol , except during viral infections. No recent hospitalizations or steroid use. - Continue levalbuterol   2 puffs every 4 hours as needed. - Monitor for exacerbations during viral infections.  Eczema Occurs primarily in winter, managed with topical hydrocortisone . - Continue topical hydrocortisone  twice daily as needed - Recommend hypoallergenic hydrating ointment at least twice daily.  This must be done daily for control of flares. (Great options include Vaseline, CeraVe, Aquaphor, Aveeno, Cetaphil, VaniCream, etc) - Recommend avoiding detergents, soaps or lotions with fragrances/dyes, and instead using products which are hypoallergenic, use second rinse cycle when washing clothes -Wear lose breathable clothing, avoid wool -Avoid extremes of humidity - Limit showers/baths to 5 minutes and use luke warm water instead of hot, pat dry following baths, and apply moisturizer  Diarrhea  Intermittent diarrhea possibly linked to food triggers. Differential includes food intolerance, IBS vs MCAS - Monitor symptoms and dietary triggers. - Consider avoidance of  suspected trigger foods until further testing. - Will get screening tryptase   Follow up: for skin testing.  HOLD Pepcid and all other anthistamines for 3 days prior  Thank you so much for letting me partake in your care today.  Don't hesitate to reach out if you have any additional concerns!  Orelia Binet, MD  [facility] [place], [place]

## 2023-12-07 NOTE — Telephone Encounter (Signed)
 Pt called to ask about allergy testing that was scheduled. 1) are hair products covered, 2) is Lidocaine  covered? Advised would review with provider/nurse and let her know since we have new patch testing, she thanked and stated can send reply back through MyChart.  ---------  Per Dr. Idolina Maker, "Hair products are with the patch testing. lidocaine  IS covered, but if it is an anaphylactic reaction, that is a different test (because it is a different mechanism)"

## 2023-12-08 NOTE — Telephone Encounter (Signed)
 Patch testing would be a different type of allergy testing.  We can consider that in the future but would like to proceed with the environmental and food allergy testing first.

## 2023-12-08 NOTE — Telephone Encounter (Signed)
 Thank you! I would like to make sure that we have the NAC 80 in place when she follows up if she is concerned with contact dermatitis. Routing to Dr. Jolayne Natter since she has seen the patient.   Drexel Gentles, MD [facility]  

## 2023-12-09 ENCOUNTER — Ambulatory Visit: Payer: Self-pay | Admitting: Internal Medicine

## 2023-12-09 LAB — TRYPTASE: Tryptase: 5.3 ug/L (ref 2.2–13.2)

## 2023-12-09 NOTE — Progress Notes (Signed)
 Tryptase level was normal.  We can consider moving forward with 24 hour urine studies next.  We can discuss at skin testing appointment

## 2023-12-15 ENCOUNTER — Ambulatory Visit (INDEPENDENT_AMBULATORY_CARE_PROVIDER_SITE_OTHER): Admitting: Internal Medicine

## 2023-12-15 ENCOUNTER — Other Ambulatory Visit (HOSPITAL_BASED_OUTPATIENT_CLINIC_OR_DEPARTMENT_OTHER): Payer: Self-pay

## 2023-12-15 DIAGNOSIS — H1013 Acute atopic conjunctivitis, bilateral: Secondary | ICD-10-CM

## 2023-12-15 DIAGNOSIS — J302 Other seasonal allergic rhinitis: Secondary | ICD-10-CM

## 2023-12-15 DIAGNOSIS — L2084 Intrinsic (allergic) eczema: Secondary | ICD-10-CM

## 2023-12-15 DIAGNOSIS — J3089 Other allergic rhinitis: Secondary | ICD-10-CM

## 2023-12-15 DIAGNOSIS — T782XXD Anaphylactic shock, unspecified, subsequent encounter: Secondary | ICD-10-CM | POA: Diagnosis not present

## 2023-12-15 DIAGNOSIS — Z8719 Personal history of other diseases of the digestive system: Secondary | ICD-10-CM

## 2023-12-15 DIAGNOSIS — J4520 Mild intermittent asthma, uncomplicated: Secondary | ICD-10-CM

## 2023-12-15 MED ORDER — EPINEPHRINE 0.3 MG/0.3ML IJ SOAJ
0.3000 mg | INTRAMUSCULAR | 1 refills | Status: DC | PRN
  Filled 2023-12-15: qty 2, 1d supply, fill #0
  Filled 2024-01-28: qty 2, 1d supply, fill #1

## 2023-12-15 NOTE — Progress Notes (Signed)
 Date of Service/Encounter:  12/15/23  Allergy testing appointment   Initial visit on 5/12/5, seen for rhinitis, food allergy, concern for MCAS, eczema .  Please see that note for additional details.  Today reports for allergy diagnostic testing:    DIAGNOSTICS:  Skin Testing: Environmental allergy panel and select foods. Adequate positive and negative controls Results discussed with patient/family.  Airborne Adult Perc - 12/15/23 1604     Time Antigen Placed 0400    Allergen Manufacturer Floyd Hutchinson    Location Back    Number of Test 55    1. Control-Buffer 50% Glycerol Negative    2. Control-Histamine 4+    3. Bahia Negative    4. [place] Negative    5. Johnson Negative    6. [place]  Blue Negative    7. Meadow Fescue Negative    8. Perennial Rye Negative    9. Timothy Negative    10. Ragweed Mix Negative    11. Cocklebur Negative    12. Plantain,  English Negative    13. Baccharis Negative    14. Dog Fennel Negative    15. [place] Thistle 2+    16. Lamb's Quarters Negative    17. Sheep Sorrell Negative    18. Rough Pigweed Negative    19. Marsh Elder, Rough Negative    21. Box, Elder Negative    22. Cedar, red Negative    23. Sweet Gum Negative    24. Pecan Pollen Negative    25. Pine Mix Negative    26. Walnut, Black Pollen Negative    27. Red Mulberry Negative    28. Ash Mix Negative    29. Birch Mix Negative    30. Beech American Negative    31. Cottonwood, [place] Negative    32. Hickory, White Negative    33. Maple Mix Negative    34. Oak, [place] Mix Negative    35. Sycamore Eastern Negative    36. Alternaria Alternata Negative    37. Cladosporium Herbarum Negative    38. Aspergillus Mix Negative    39. Penicillium Mix Negative    40. Bipolaris Sorokiniana (Helminthosporium) Negative    41. Drechslera Spicifera (Curvularia) Negative    42. Mucor Plumbeus Negative    43. Fusarium Moniliforme Negative    44. Aureobasidium Pullulans (pullulara) Negative     45. Rhizopus Oryzae Negative    46. Botrytis Cinera Negative    47. Epicoccum Nigrum Negative    48. Phoma Betae Negative    49. Dust Mite Mix Negative    50. Cat Hair 10,000 BAU/ml Negative    51.  Dog Epithelia Negative    52. Mixed Feathers Negative    53. Horse Epithelia Negative    54. Cockroach, German Negative    55. Tobacco Leaf Negative             Intradermal - 12/15/23 1643     Time Antigen Placed 0430    Location Arm    Number of Test 16    Control 4+    Bahia 2+    [place] 2+    Johnson 2+    7 Grass 2+    Ragweed Mix Negative    Weed Mix 3+    Tree Mix 2+    Mold 1 Negative    Mold 2 3+    Mold 3 2+    Mold 4 3+    Mite Mix 3+    Cat --   +/-   Dog --   +/-  Cockroach 2+             Food Adult Perc - 12/15/23 1600     Time Antigen Placed 0400    Allergen Manufacturer Floyd Hutchinson    Location Back    Number of allergen test 5    23. Shrimp Negative    24. Crab 2+    25. Lobster Negative    26. Oyster 2+    27. Scallops Negative             Allergy testing results were read and interpreted by myself, documented by clinical staff.  Patient provided with copy of allergy testing along with avoidance measures when indicated.   Orelia Binet, MD  [facility] [place] 

## 2023-12-15 NOTE — Patient Instructions (Addendum)
 Allergy test (12/15/2023): Positive to grass, weed, tree, mold, dust mite, roach, borderline to cat and dog;   positive to lobster and oyster Start avoidance measures  Seasonal allergic rhinitis Breakthrough symptoms despite triamcinolone . Previous antihistamine use caused severe withdrawal symptoms. - Continue triamcinolone  nasal spray as needed  - Consider cautious reintroduction of oral antihistamines if symptoms persist.  Shellfish allergy - Avoid shellfish and mollusks - Continue to carry EpiPen  and follow food allergy action plan  Asthma, mild intermittent  Well-controlled with levalbuterol , except during viral infections. No recent hospitalizations or steroid use. - Continue levalbuterol   2 puffs every 4 hours as needed. - Monitor for exacerbations during viral infections.  Eczema Occurs primarily in winter, managed with topical hydrocortisone . - Continue topical hydrocortisone  twice daily as needed - Recommend hypoallergenic hydrating ointment at least twice daily.  This must be done daily for control of flares. (Great options include Vaseline, CeraVe, Aquaphor, Aveeno, Cetaphil, VaniCream, etc) - Recommend avoiding detergents, soaps or lotions with fragrances/dyes, and instead using products which are hypoallergenic, use second rinse cycle when washing clothes -Wear lose breathable clothing, avoid wool -Avoid extremes of humidity - Limit showers/baths to 5 minutes and use luke warm water instead of hot, pat dry following baths, and apply moisturizer  Diarrhea  Intermittent diarrhea possibly linked to food triggers. Differential includes food intolerance, IBS vs MCAS - Baseline tryptase was normal, lab form provided for symptomatic tryptase which needs to be obtained 1 hour after onset of symptoms during a flare for further evaluation of MCAS   Follow up: 6 months   Thank you so much for letting me partake in your care today.  Don't hesitate to reach out if you have any  additional concerns!  Orelia Binet, MD   Reducing Pollen Exposure  The American Academy of Allergy, Asthma and Immunology suggests the following steps to reduce your exposure to pollen during allergy seasons.    Do not hang sheets or clothing out to dry; pollen may collect on these items. Do not mow lawns or spend time around freshly cut grass; mowing stirs up pollen. Keep windows closed at night.  Keep car windows closed while driving. Minimize morning activities outdoors, a time when pollen counts are usually at their highest. Stay indoors as much as possible when pollen counts or humidity is high and on windy days when pollen tends to remain in the air longer. Use air conditioning when possible.  Many air conditioners have filters that trap the pollen spores. Use a HEPA room air filter to remove pollen form the indoor air you breathe.  Control of Mold Allergen   Mold and fungi can grow on a variety of surfaces provided certain temperature and moisture conditions exist.  Outdoor molds grow on plants, decaying vegetation and soil.  The major outdoor mold, Alternaria and Cladosporium, are found in very high numbers during hot and dry conditions.  Generally, a late Summer - Fall peak is seen for common outdoor fungal spores.  Rain will temporarily lower outdoor mold spore count, but counts rise rapidly when the rainy period ends.  The most important indoor molds are Aspergillus and Penicillium.  Dark, humid and poorly ventilated basements are ideal sites for mold growth.  The next most common sites of mold growth are the bathroom and the kitchen.  Outdoor (Seasonal) Mold Control  Use air conditioning and keep windows closed Avoid exposure to decaying vegetation. Avoid leaf raking. Avoid grain handling. Consider wearing a face mask if working in [employer]  areas.    Indoor (Perennial) Mold Control   Maintain humidity below 50%. Clean washable surfaces with 5% bleach solution. Remove  sources e.g. contaminated carpets.   DUST MITE AVOIDANCE MEASURES:  There are three main measures that need and can be taken to avoid house dust mites:  Reduce accumulation of dust in general -reduce furniture, clothing, carpeting, books, stuffed animals, especially in bedroom  Separate yourself from the dust -use pillow and mattress encasements (can be found at stores such as [employer] or online) -avoid direct exposure to air condition flow -use a HEPA filter device, especially in the bedroom; you can also use a HEPA filter vacuum cleaner -wipe dust with a moist towel instead of a dry towel or broom when cleaning  Decrease mites and/or their secretions -wash clothing and linen and stuffed animals at highest temperature possible, at least every 2 weeks -stuffed animals can also be placed in a bag and put in a freezer overnight  Despite the above measures, it is impossible to eliminate dust mites or their allergen completely from your home.  With the above measures the burden of mites in your home can be diminished, with the goal of minimizing your allergic symptoms.  Success will be reached only when implementing and using all means together.   Control of Cockroach Allergen  Cockroach allergen has been identified as an important cause of acute attacks of asthma, especially in urban settings.  There are fifty-five species of cockroach that exist in the [place] , however only three, the [place], [place] and [place] species produce allergen that can affect patients with Asthma.  Allergens can be obtained from fecal particles, egg casings and secretions from cockroaches.    Remove food sources. Reduce access to water. Seal access and entry points. Spray runways with 0.5-1% Diazinon or Chlorpyrifos Blow boric acid power under stoves and refrigerator. Place bait stations (hydramethylnon) at feeding sites.  Control of Dog or Cat Allergen  Avoidance is the best way to  manage a dog or cat allergy. If you have a dog or cat and are allergic to dog or cats, consider removing the dog or cat from the home. If you have a dog or cat but don't want to find it a new home, or if your family wants a pet even though someone in the household is allergic, here are some strategies that may help keep symptoms at bay:  Keep the pet out of your bedroom and restrict it to only a few rooms. Be advised that keeping the dog or cat in only one room will not limit the allergens to that room. Don't pet, hug or kiss the dog or cat; if you do, wash your hands with soap and water. High-efficiency particulate air (HEPA) cleaners run continuously in a bedroom or living room can reduce allergen levels over time. Regular use of a high-efficiency vacuum cleaner or a central vacuum can reduce allergen levels. Giving your dog or cat a bath at least once a week can reduce airborne allergen.  [facility] [place] [place]

## 2023-12-18 ENCOUNTER — Other Ambulatory Visit (HOSPITAL_BASED_OUTPATIENT_CLINIC_OR_DEPARTMENT_OTHER): Payer: Self-pay

## 2023-12-18 DIAGNOSIS — R7989 Other specified abnormal findings of blood chemistry: Secondary | ICD-10-CM | POA: Diagnosis not present

## 2023-12-18 DIAGNOSIS — R5383 Other fatigue: Secondary | ICD-10-CM | POA: Diagnosis not present

## 2023-12-18 DIAGNOSIS — F32A Depression, unspecified: Secondary | ICD-10-CM | POA: Diagnosis not present

## 2023-12-23 DIAGNOSIS — D225 Melanocytic nevi of trunk: Secondary | ICD-10-CM | POA: Diagnosis not present

## 2023-12-23 DIAGNOSIS — D2261 Melanocytic nevi of right upper limb, including shoulder: Secondary | ICD-10-CM | POA: Diagnosis not present

## 2023-12-23 DIAGNOSIS — Z85820 Personal history of malignant melanoma of skin: Secondary | ICD-10-CM | POA: Diagnosis not present

## 2023-12-23 DIAGNOSIS — L814 Other melanin hyperpigmentation: Secondary | ICD-10-CM | POA: Diagnosis not present

## 2023-12-23 DIAGNOSIS — D2262 Melanocytic nevi of left upper limb, including shoulder: Secondary | ICD-10-CM | POA: Diagnosis not present

## 2023-12-23 DIAGNOSIS — D2272 Melanocytic nevi of left lower limb, including hip: Secondary | ICD-10-CM | POA: Diagnosis not present

## 2023-12-23 DIAGNOSIS — Z86006 Personal history of melanoma in-situ: Secondary | ICD-10-CM | POA: Diagnosis not present

## 2023-12-28 DIAGNOSIS — G4733 Obstructive sleep apnea (adult) (pediatric): Secondary | ICD-10-CM | POA: Diagnosis not present

## 2023-12-28 DIAGNOSIS — E538 Deficiency of other specified B group vitamins: Secondary | ICD-10-CM | POA: Diagnosis not present

## 2023-12-28 DIAGNOSIS — E559 Vitamin D deficiency, unspecified: Secondary | ICD-10-CM | POA: Diagnosis not present

## 2023-12-28 DIAGNOSIS — E611 Iron deficiency: Secondary | ICD-10-CM | POA: Diagnosis not present

## 2023-12-28 NOTE — Progress Notes (Signed)
 Telemedicine video encounter documentation  This video encounter was conducted with the patient's (or proxy's) verbal consent via secure, interactive audio and video telecommunications while in clinic/office/hospital.  The patient (or proxy) was instructed to have this encounter in a suitably private space and to only have persons present to whom they give permission to participate. In addition, patient identity was confirmed by use of name plus an additional identifier.  {TIP (Deletes when note or encounter signed)   For Telephone and Video Visits, both time based and MDM are allowed. This visit was coded based on medical decision making (MDM).    Chief Complaint:   Chief Complaint  Patient presents with  . Lab Results    Requesting prescriptions and referrals     Subjective:   Carol Porter is a 33 y.o. female established patient. Video visit today is for:  HPI  F/u of labs: Had asked PCP to order some labs in the setting of fatigue and bone pain Vitamin D  found to be low; has already been taking 2000 IUs daily B12 also low normal; currently takes 1000 mcg B12 daily Reports no major change to her diet Denies history of bariatric surgery Does have heavy periods  Also has a history of sleep apnea on CPAP; now working at an outside health system and would like to establish with [facility], would like referral for titration   Patient Active Problem List  Diagnosis  . Hemorrhoid  . Mild intermittent asthma without complication (HHS-HCC)  . Inappropriate sinus tachycardia (CMS/HHS-HCC)  . PCOS (polycystic ovarian syndrome)  . Generalized anxiety disorder  . Positive ANA (antinuclear antibody)  . OSA on CPAP  . Idiopathic intracranial hypertension  . Intractable chronic migraine without aura and without status migrainosus  . Pulsatile tinnitus  . Morbid obesity with BMI of 40.0-44.9, adult (CMS/HHS-HCC)  . Personal history of other malignant neoplasm of skin  . Mass  of upper inner quadrant of left breast  . Dysautonomia (CMS/HHS-HCC)    Outpatient Medications Prior to Visit  Medication Sig Dispense Refill  . famotidine (PEPCID) 20 MG tablet Take 20 mg by mouth 2 (two) times daily    . FLUoxetine  (PROZAC ) 20 MG tablet Take 1.5 tablets (30 mg total) by mouth once daily 135 tablet 4  . hydrocortisone  (ANUSOL -HC) 25 mg suppository Place 1 suppository (25 mg total) rectally 2 (two) times daily as needed for Hemorrhoids 20 suppository 3  . levalbuterol  (XOPENEX  HFA) inhaler Inhale 2 inhalations into the lungs every 6 (six) hours as needed for Wheezing 15 g 2  . magnesium glycinate 100 mg Tab Take 400 mg by mouth 1x a day    . MULTIVITAMIN-FERROUS SULFATE ORAL Take by mouth    . promethazine  (PHENERGAN ) 12.5 MG suppository     . metFORMIN  (GLUCOPHAGE -XR) 500 MG XR tablet Take 2 tablets (1,000 mg total) by mouth daily with dinner (Patient not taking: Reported on 12/28/2023) 60 tablet 11   No facility-administered medications prior to visit.    Allergies  Allergen Reactions  . Omeprazole Hives    Social History   Socioeconomic History  . Marital status: Married    Spouse name: Winnona Wargo  . Number of children: 0  . Years of education: 16  . Highest education level: Master's degree (e.g., MA, MS, MEng, MEd, MSW, MBA)  Occupational History  . Occupation: PA at [employer]  Tobacco Use  . Smoking status: Never  . Smokeless tobacco: Never  Vaping Use  . Vaping status:  Never Used  Substance and Sexual Activity  . Alcohol use: Not Currently    Alcohol/week: 0.0 standard drinks of alcohol    Comment: Rare to none. 1 drink per 2-3 months  . Drug use: Never  . Sexual activity: Yes    Partners: Male    Birth control/protection: None, Condom  Other Topics Concern  . Would you please tell us  about the people who live in your home, your pets, or anything else important to your social life? Yes    Comment: married. has dog. Raises cattle   Social History Narrative   Marital status: married x 06/2017      Children: none; respite foster care parents in 2024.   Jerrye daughter is 3 y o in 2024.      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       Education: working on Systems analyst in 2023.  DMS.      Tobacco: none      Alcohol: rare      Exercise: none in 2023; takes stairs at work.    Social Drivers of Health   Financial Resource Strain: Medium Risk (02/27/2023)   Overall Financial Resource Strain (CARDIA)   . Difficulty of Paying Living Expenses: Somewhat hard  Food Insecurity: No Food Insecurity (02/27/2023)   Hunger Vital Sign   . Worried About Programme researcher, broadcasting/film/video in the Last Year: Never true   . Ran Out of Food in the Last Year: Never true  Transportation Needs: No Transportation Needs (02/27/2023)   PRAPARE - Transportation   . Lack of Transportation (Medical): No   . Lack of Transportation (Non-Medical): No  Physical Activity: Insufficiently Active (06/22/2019)   Exercise Vital Sign   . Days of Exercise per Week: 1 day   . Minutes of Exercise per Session: 30 min  Stress: No Stress Concern Present (06/22/2019)   [employer] of Occupational Health - Occupational Stress Questionnaire   . Feeling of Stress : Not at all  Social Connections: Moderately Isolated (06/22/2019)   Social Connection and Isolation Panel [NHANES]   . Frequency of Communication with Friends and Family: Three times a week   . Frequency of Social Gatherings with Friends and Family: Once a week   . Attends Religious Services: Never   . Active Member of Clubs or Organizations: No   . Attends Banker Meetings: Never   . Marital Status: Married  Housing Stability: Low Risk  (11/13/2023)   Housing Stability Vital Sign   . Unable to Pay for Housing in the Last Year: No   . Number of Times Moved in the Last Year: 0   . Homeless in the  Last Year: No    ROS  See HPI for other ROS  Objective:   Vitals as reported by patient: Vitals:   12/28/23 1652  Weight: (!) 148.3 kg (327 lb)  Height: 173.2 cm (5' 8.19)  PainSc: 0-No pain   Body mass index is 49.44 kg/m.  Constitutional: alert, interactive with provider, cooperative, in no distress Mental status: oriented x 3, good historian, appropriate mood and behavior, thought content appears normal Respiratory: no respiratory distress, no audible wheezing   Assessment/Plan:   Diagnoses and all orders for this visit:  B12 deficiency -     cyanocobalamin  (VITAMIN B12) 1,000 mcg/mL injection; Inject 1 mL (1,000 mcg total) into the  muscle monthly -     safety needles 25 gauge x 1 Ndle; Use 1 Dose monthly  Vitamin D  deficiency -     ergocalciferol , vitamin D2, 1,250 mcg (50,000 unit) capsule; Take 1 capsule (50,000 Units total) by mouth once a week  Iron  deficiency -     iron  fum,ps-FA-vit B,C#18-Lact 130 mg iron  -1,250 mcg Cap; Take 1 capsule by mouth once daily  OSA on CPAP -     EXTERNAL Ambulatory Referral to Sleep Study: For referral LEAVING the [facility] ONLY. Internal referrals should use the orderables listed above.  Video visit for review of current labs as well as sleep study referral.  Has already been taking vitamin D  and B12 supplements.  Discussed options for vitamin D  of supplementation including doubling current dose versus high-dose weekly supplement; she prefers the latter, I have sent a prescription to the pharmacy.  Regarding B12, she would like to try B12 injections, prescriptions sent to pharmacy.  Regarding iron  deficiency, would like to try a specific vitamin if covered, sent to pharmacy.  Regarding sleep apnea; she already has a CPAP, though would like to do another titration study to make sure she is getting an adequate pressure.  Would like to go to [facility]; we will fax referral.   This visit was coded based on medical decision making  (MDM).  Follow-up as planned PCP, sooner if needed.  Future Appointments     Date/Time Provider Department Center Visit Type   02/19/2024 8:15 AM (Arrive by 8:00 AM) Gretel Wanda NOVAK, MPH [facility] VIDEO VISIT RETURN   03/01/2024 11:40 AM (Arrive by 11:25 AM) Charleen Connell Norris, MD [facility] [place] [facility] VIDEO VISIT RETURN ADULT   03/04/2024 3:00 PM (Arrive by 2:45 PM) Claudene Rayfield Lunger, MD [facility] PHYSICAL   03/29/2024 1:00 PM (Arrive by 12:45 PM) Garret Elsie GORMAN Mickey., MD [facility] NEW PATIENT       Rocky Crate, MD PhD [facility]  This note was written using Dragon dictation. There may be spelling and/or grammatical errors and phrases that were mistakenly interpreted by the software and missed in my review.

## 2023-12-29 ENCOUNTER — Other Ambulatory Visit (HOSPITAL_BASED_OUTPATIENT_CLINIC_OR_DEPARTMENT_OTHER): Payer: Self-pay

## 2023-12-29 MED ORDER — ERGOCALCIFEROL 1.25 MG (50000 UT) PO CAPS
50000.0000 [IU] | ORAL_CAPSULE | ORAL | 3 refills | Status: AC
  Filled 2023-12-29: qty 12, 84d supply, fill #0
  Filled 2024-05-05: qty 12, 84d supply, fill #1
  Filled 2024-08-02: qty 12, 84d supply, fill #2

## 2023-12-29 MED ORDER — BD LUER-LOK SYRINGE 25G X 1" 3 ML MISC
1.0000 | 1 refills | Status: AC
  Filled 2023-12-29: qty 12, 365d supply, fill #0

## 2023-12-29 MED ORDER — FUSION PLUS PO CAPS
1.0000 | ORAL_CAPSULE | Freq: Every day | ORAL | 3 refills | Status: AC
Start: 2023-12-28 — End: ?
  Filled 2023-12-29: qty 90, 90d supply, fill #0

## 2023-12-29 MED ORDER — CYANOCOBALAMIN 1000 MCG/ML IJ SOLN
1000.0000 ug | INTRAMUSCULAR | 3 refills | Status: AC
  Filled 2023-12-29: qty 3, 90d supply, fill #0
  Filled 2024-03-16: qty 3, 90d supply, fill #1
  Filled 2024-06-08 – 2024-06-13 (×2): qty 3, 90d supply, fill #2
  Filled 2024-08-02: qty 3, 90d supply, fill #3

## 2023-12-30 ENCOUNTER — Telehealth: Payer: Self-pay

## 2023-12-30 NOTE — Telephone Encounter (Signed)
 Received referral from Damian [facility] MD at [facility] [phone] for OSA on CPAP, excessive daytime sleepiness, habitual snoring, choking or gasping during sleep.  Had sleep study at [facility] - 2016. This was sent with referral but is pixilated and hard to read. Called and requested new copy be sent over.

## 2024-01-08 ENCOUNTER — Other Ambulatory Visit (HOSPITAL_BASED_OUTPATIENT_CLINIC_OR_DEPARTMENT_OTHER): Payer: Self-pay

## 2024-01-18 ENCOUNTER — Other Ambulatory Visit (HOSPITAL_BASED_OUTPATIENT_CLINIC_OR_DEPARTMENT_OTHER): Payer: Self-pay

## 2024-01-22 DIAGNOSIS — K640 First degree hemorrhoids: Secondary | ICD-10-CM | POA: Diagnosis not present

## 2024-01-27 ENCOUNTER — Ambulatory Visit

## 2024-01-27 ENCOUNTER — Ambulatory Visit: Admitting: Family Medicine

## 2024-01-28 ENCOUNTER — Other Ambulatory Visit (HOSPITAL_BASED_OUTPATIENT_CLINIC_OR_DEPARTMENT_OTHER): Admitting: Radiology

## 2024-01-28 ENCOUNTER — Other Ambulatory Visit (HOSPITAL_BASED_OUTPATIENT_CLINIC_OR_DEPARTMENT_OTHER): Payer: Self-pay

## 2024-02-01 ENCOUNTER — Other Ambulatory Visit

## 2024-02-03 DIAGNOSIS — G4733 Obstructive sleep apnea (adult) (pediatric): Secondary | ICD-10-CM | POA: Diagnosis not present

## 2024-02-15 ENCOUNTER — Institutional Professional Consult (permissible substitution): Admitting: Neurology

## 2024-02-19 DIAGNOSIS — Z6841 Body Mass Index (BMI) 40.0 and over, adult: Secondary | ICD-10-CM | POA: Diagnosis not present

## 2024-02-19 DIAGNOSIS — E66813 Obesity, class 3: Secondary | ICD-10-CM | POA: Diagnosis not present

## 2024-03-04 DIAGNOSIS — G901 Familial dysautonomia [Riley-Day]: Secondary | ICD-10-CM | POA: Diagnosis not present

## 2024-03-04 DIAGNOSIS — F411 Generalized anxiety disorder: Secondary | ICD-10-CM | POA: Diagnosis not present

## 2024-03-04 DIAGNOSIS — G4733 Obstructive sleep apnea (adult) (pediatric): Secondary | ICD-10-CM | POA: Diagnosis not present

## 2024-03-04 DIAGNOSIS — G932 Benign intracranial hypertension: Secondary | ICD-10-CM | POA: Diagnosis not present

## 2024-03-04 DIAGNOSIS — Z0000 Encounter for general adult medical examination without abnormal findings: Secondary | ICD-10-CM | POA: Diagnosis not present

## 2024-03-04 DIAGNOSIS — E611 Iron deficiency: Secondary | ICD-10-CM | POA: Diagnosis not present

## 2024-03-04 DIAGNOSIS — J4520 Mild intermittent asthma, uncomplicated: Secondary | ICD-10-CM | POA: Diagnosis not present

## 2024-03-10 ENCOUNTER — Ambulatory Visit (INDEPENDENT_AMBULATORY_CARE_PROVIDER_SITE_OTHER): Admitting: Family Medicine

## 2024-03-10 VITALS — BP 125/66 | HR 78 | Ht 69.0 in | Wt 330.0 lb

## 2024-03-10 DIAGNOSIS — N9089 Other specified noninflammatory disorders of vulva and perineum: Secondary | ICD-10-CM

## 2024-03-10 NOTE — Progress Notes (Signed)
   Subjective:    Patient ID: Carol Porter, female    DOB: 02/17/1991, 33 y.o.   MRN: [id number]  HPI  Patient felt a small pea sized bump on her right vulva that was tender to touch, but not impeding ambulation. She knows that she has some prominent veins on the vulva and was concerned about a superficial blood clot and took a full aspirin  a day for several days. It has gone completely away. She has never had anything like this before. No ulceration, redness, streaking, etc.  Review of Systems     Objective:   Physical Exam Vitals reviewed. Exam conducted with a chaperone present.  Constitutional:      Appearance: Normal appearance.  Abdominal:     Hernia: There is no hernia in the left inguinal area or right inguinal area.  Genitourinary:    General: Normal vulva.     Comments: Some mildly prominent vulvar veins but no varicosities. No masses palpated  Lymphadenopathy:     Lower Body: No right inguinal adenopathy. No left inguinal adenopathy.  Neurological:     General: No focal deficit present.     Mental Status: She is alert.  Psychiatric:        Mood and Affect: Mood normal.        Behavior: Behavior normal.        Thought Content: Thought content normal.        Judgment: Judgment normal.        Assessment & Plan:  1. Vulvar lump (Primary) Appears resolved. Patient to call if reoccurs - will try to work in.

## 2024-03-11 DIAGNOSIS — K640 First degree hemorrhoids: Secondary | ICD-10-CM | POA: Diagnosis not present

## 2024-03-16 ENCOUNTER — Other Ambulatory Visit (HOSPITAL_BASED_OUTPATIENT_CLINIC_OR_DEPARTMENT_OTHER): Payer: Self-pay

## 2024-03-16 ENCOUNTER — Inpatient Hospital Stay: Attending: Family | Admitting: Family

## 2024-03-16 ENCOUNTER — Inpatient Hospital Stay

## 2024-03-16 ENCOUNTER — Other Ambulatory Visit: Payer: Self-pay | Admitting: Family

## 2024-03-16 VITALS — BP 130/67 | HR 96 | Temp 98.9°F | Resp 17 | Wt 330.1 lb

## 2024-03-16 DIAGNOSIS — I808 Phlebitis and thrombophlebitis of other sites: Secondary | ICD-10-CM | POA: Insufficient documentation

## 2024-03-16 DIAGNOSIS — D6859 Other primary thrombophilia: Secondary | ICD-10-CM

## 2024-03-16 DIAGNOSIS — Z79899 Other long term (current) drug therapy: Secondary | ICD-10-CM | POA: Insufficient documentation

## 2024-03-16 DIAGNOSIS — Z7982 Long term (current) use of aspirin: Secondary | ICD-10-CM | POA: Diagnosis not present

## 2024-03-16 DIAGNOSIS — I809 Phlebitis and thrombophlebitis of unspecified site: Secondary | ICD-10-CM

## 2024-03-16 DIAGNOSIS — I82890 Acute embolism and thrombosis of other specified veins: Secondary | ICD-10-CM | POA: Diagnosis not present

## 2024-03-16 LAB — ANTITHROMBIN III: AntiThromb III Func: 105 % (ref 75–120)

## 2024-03-16 NOTE — Progress Notes (Signed)
 Hematology/Oncology Consultation   Name: Dealie Koelzer      MRN: [id number]    Location: Room/bed info not found  Date: 03/16/2024 Time:3:31 PM   REFERRING PHYSICIAN:  Self  REASON FOR CONSULT:  Superficial thrombosis    DIAGNOSIS: Superficial thrombosis   HISTORY OF PRESENT ILLNESS:  Ms. Franey is a very pleasant 33 yo caucasian female with recent thrombosed vein of the right vulva.  She took 2 baby aspirin  daily for 4 days and the pain and swelling improved. She is now taking 1 baby aspirin  daily.  She states that she had sclerosing treatment of hemorrhoids a week prior to this thrombosed vein occurring.  She has history of varicose veins of the abdomen, genitals and veins. Her mother also has history significant for varicose veins with superficial thrombophlebitis.   No prior personal history of thrombotic event. No history of DVT or PE.  No recent travel or illness.  She has 2 intravascular stent placed in the brain for treatment of idiopathic intracranial HTN. This causes intermittent migraines.  She has had 2 seizures in the past that have been labeled as idiopathic. She notes that the first on occurred within 3 hours of receiving Botox  injections for migraine and the second occurred within the following 3 months. She feels these may be related to Botox  toxicity.  She has positive ANA of unknown source.  Her cycle is irregular occurring every 2 months or so. Flow is heavy with small clots noted lasting around 7 days. No use of birth control or other hormone replacement therapy.  No other blood loss noted. No abnormal bruising, no petechiae.  She has history of melanoma of the right heel successfully removed. She follows up with dermatology regularly.  No known familial history of cancer.  No history of diabetes or thyroid  disease.  She states that around the age of 33 yo her IgG level was 0. This was followed and last check several years ago was back within normal limits.  No  fever, chills, n/v, cough, rash, dizziness, SOB, chest pain, palpitations, abdominal pain or changes in bowel or bladder habits.  No tenderness, numbness or tingling in her extremities.  No falls or syncope reported.  No smoking, ETOH or recreational drug use.  Appetite and hydration are good. Weight is stable at 330 lbs.   ROS: All other 10 point review of systems is negative.   PAST MEDICAL HISTORY:   Past Medical History:  Diagnosis Date   Allergy     Anxiety    Asthma    Depression    Idiopathic intracranial hypertension    Shingles     ALLERGIES: Allergies  Allergen Reactions   Omeprazole Hives, Itching, Rash and Other (See Comments)   Phentermine     Fast heart rate and light headed      MEDICATIONS:  Current Outpatient Medications on File Prior to Visit  Medication Sig Dispense Refill   cyanocobalamin  (VITAMIN B12) 1000 MCG/ML injection Inject 1 mL (1,000 mcg total) into the muscle every 30 (thirty) days. 3 mL 3   EPINEPHrine  0.3 mg/0.3 mL IJ SOAJ injection Inject 0.3 mg into the muscle as needed for anaphylaxis. 2 each 1   ergocalciferol  (VITAMIN D2) 1.25 MG (50000 UT) capsule Take 1 capsule (50,000 Units total) by mouth once a week. 12 capsule 3   famotidine (PEPCID) 20 MG tablet Take by mouth.     furosemide  (LASIX ) 20 MG tablet Take 1 tablet (20 mg total) by mouth 2 (two)  times daily as needed. 60 tablet 1   hydrocortisone  (ANUSOL -HC) 25 MG suppository Place 1 suppository (25 mg total) rectally 2 (two) times daily as needed for Hemorrhoids 20 suppository 3   hydrocortisone  (ANUSOL -HC) 25 MG suppository Place 1 suppository (25 mg total) rectally 2 (two) times daily as needed for Hemorrhoids 20 suppository 3   Iron -FA-B Cmp-C-Biot-Probiotic (FUSION PLUS) CAPS Take 1 capsule by mouth daily. 90 capsule 3   lamoTRIgine  (LAMICTAL ) 25 MG tablet Take 1 tablet (25 mg total) by mouth at bedtime for 14 days, THEN 2 tablets (50 mg total) at bedtime for 14 days. 42 tablet 0    levalbuterol  (XOPENEX  HFA) 45 MCG/ACT inhaler Inhale 2 inhalations into the lungs every 6 (six) hours as needed for Wheezing 15 g 2   Magnesium Bisglycinate (MAG GLYCINATE) 100 MG TABS Take by mouth daily.     Multiple Vitamins-Iron  (MULTI-VITAMIN/IRON  PO) Take by mouth daily.     SYRINGE-NEEDLE, DISP, 3 ML (B-D 3CC LUER-LOK SYR 25GX1) 25G X 1 3 ML MISC Inject 1 each into the muscle every 30 (thirty) days. 50 each 1   triamcinolone  (NASACORT ) 55 MCG/ACT AERO nasal inhaler Use 2 sprays in each nostril once daily 16.9 mL 12   [DISCONTINUED] metFORMIN  (GLUCOPHAGE -XR) 500 MG 24 hr tablet Take 2 tablets (1,000 mg total) by mouth daily with dinner. (Patient not taking: Reported on 12/07/2023) 60 tablet 11   [DISCONTINUED] sucralfate (CARAFATE) 1 g tablet Take 1 tablet by mouth 4 (four) times daily.     [DISCONTINUED] SYEDA 3-0.03 MG tablet Take 1 tablet by mouth daily.     No current facility-administered medications on file prior to visit.     PAST SURGICAL HISTORY Past Surgical History:  Procedure Laterality Date   LUMBAR PUNCTURE      FAMILY HISTORY: No family history on file.  SOCIAL HISTORY:  reports that she has never smoked. She has never used smokeless tobacco. She reports that she does not currently use alcohol. She reports that she does not use drugs.  PERFORMANCE STATUS: The patient's performance status is 1 - Symptomatic but completely ambulatory  PHYSICAL EXAM: Most Recent Vital Signs: Last menstrual period 03/04/2024. BP 130/67 (BP Location: Left Arm, Patient Position: Sitting, Cuff Size: Large)   Pulse 96   Temp 98.9 F (37.2 C) (Oral)   Resp 17   Wt (!) 330 lb 1.9 oz (149.7 kg)   LMP 03/04/2024 (Exact Date)   SpO2 100%   BMI 48.75 kg/m   General Appearance:    Alert, cooperative, no distress, appears stated age  Head:    Normocephalic, without obvious abnormality, atraumatic  Eyes:    PERRL, conjunctiva/corneas clear, EOM's intact, fundi    benign, both eyes         Throat:   Lips, mucosa, and tongue normal; teeth and gums normal  Neck:   Supple, symmetrical, trachea midline, no adenopathy;    thyroid :  no enlargement/tenderness/nodules; no carotid   bruit or JVD  Back:     Symmetric, no curvature, ROM normal, no CVA tenderness  Lungs:     Clear to auscultation bilaterally, respirations unlabored  Chest Wall:    No tenderness or deformity   Heart:    Regular rate and rhythm, S1 and S2 normal, no murmur, rub   or gallop     Abdomen:     Soft, non-tender, bowel sounds active all four quadrants,    no masses, no organomegaly        Extremities:  Extremities normal, atraumatic, no cyanosis or edema  Pulses:   2+ and symmetric all extremities  Skin:   Skin color, texture, turgor normal, no rashes or lesions  Lymph nodes:   Cervical, supraclavicular, and axillary nodes normal  Neurologic:   CNII-XII intact, normal strength, sensation and reflexes    throughout    LABORATORY DATA:  No results found for this or any previous visit (from the past 48 hours).    RADIOGRAPHY: No results found.     PATHOLOGY: None  ASSESSMENT/PLAN: Ms. Beharry is a very pleasant 33 yo caucasian female with recent thrombosed vein of the right vulva.  Hyper coag panel is pending.  She is currently on aspirin  and pain and swelling have resolved. Continue this for now. We will reassess treatment plan once labs have resulted.  Follow-up pending results.   All questions were answered. The patient knows to call the clinic with any problems, questions or concerns. We can certainly see the patient much sooner if necessary.  The patient was discussed with Dr. Timmy and he is in agreement with the aforementioned.   Lauraine Pepper, NP

## 2024-03-17 LAB — PROTEIN S ACTIVITY: Protein S Activity: 123 % (ref 63–140)

## 2024-03-17 LAB — LUPUS ANTICOAGULANT PANEL
DRVVT: 44.4 s (ref 0.0–47.0)
PTT Lupus Anticoagulant: 39.2 s (ref 0.0–43.5)

## 2024-03-17 LAB — PROTEIN S, TOTAL: Protein S Ag, Total: 121 % (ref 60–150)

## 2024-03-17 LAB — PROTEIN C ACTIVITY: Protein C Activity: 102 % (ref 73–180)

## 2024-03-18 LAB — CARDIOLIPIN ANTIBODIES, IGG, IGM, IGA
Anticardiolipin IgA: <9 U/mL (ref 0–11)
Anticardiolipin IgG: <9 GPL U/mL (ref 0–14)
Anticardiolipin IgM: <9 [MPL'U]/mL (ref 0–12)

## 2024-03-18 LAB — BETA-2-GLYCOPROTEIN I ABS, IGG/M/A
Beta-2 Glyco I IgG: <9 GPI IgG units (ref 0–20)
Beta-2-Glycoprotein I IgA: <9 GPI IgA units (ref 0–25)
Beta-2-Glycoprotein I IgM: <9 GPI IgM units (ref 0–32)

## 2024-03-20 LAB — PROTEIN C, TOTAL: Protein C, Total: 69 % (ref 60–150)

## 2024-03-21 LAB — PROTHROMBIN GENE MUTATION

## 2024-03-21 LAB — FACTOR 5 LEIDEN

## 2024-03-24 ENCOUNTER — Encounter: Payer: Self-pay | Admitting: Neurology

## 2024-03-29 ENCOUNTER — Other Ambulatory Visit (HOSPITAL_BASED_OUTPATIENT_CLINIC_OR_DEPARTMENT_OTHER): Payer: Self-pay

## 2024-03-29 ENCOUNTER — Ambulatory Visit (INDEPENDENT_AMBULATORY_CARE_PROVIDER_SITE_OTHER): Admitting: Neurology

## 2024-03-29 ENCOUNTER — Encounter: Payer: Self-pay | Admitting: Neurology

## 2024-03-29 VITALS — BP 112/81 | HR 91 | Ht 69.0 in | Wt 330.0 lb

## 2024-03-29 DIAGNOSIS — R768 Other specified abnormal immunological findings in serum: Secondary | ICD-10-CM

## 2024-03-29 DIAGNOSIS — G932 Benign intracranial hypertension: Secondary | ICD-10-CM

## 2024-03-29 DIAGNOSIS — R7303 Prediabetes: Secondary | ICD-10-CM | POA: Diagnosis not present

## 2024-03-29 DIAGNOSIS — Z6841 Body Mass Index (BMI) 40.0 and over, adult: Secondary | ICD-10-CM

## 2024-03-29 DIAGNOSIS — I4711 Inappropriate sinus tachycardia, so stated: Secondary | ICD-10-CM

## 2024-03-29 DIAGNOSIS — G4733 Obstructive sleep apnea (adult) (pediatric): Secondary | ICD-10-CM | POA: Diagnosis not present

## 2024-03-29 DIAGNOSIS — Z9189 Other specified personal risk factors, not elsewhere classified: Secondary | ICD-10-CM | POA: Insufficient documentation

## 2024-03-29 DIAGNOSIS — Z1331 Encounter for screening for depression: Secondary | ICD-10-CM | POA: Diagnosis not present

## 2024-03-29 DIAGNOSIS — E282 Polycystic ovarian syndrome: Secondary | ICD-10-CM | POA: Diagnosis not present

## 2024-03-29 DIAGNOSIS — F324 Major depressive disorder, single episode, in partial remission: Secondary | ICD-10-CM | POA: Diagnosis not present

## 2024-03-29 DIAGNOSIS — E66813 Obesity, class 3: Secondary | ICD-10-CM | POA: Diagnosis not present

## 2024-03-29 MED ORDER — ALPRAZOLAM 0.5 MG PO TABS
0.5000 mg | ORAL_TABLET | Freq: Every evening | ORAL | 0 refills | Status: AC | PRN
  Filled 2024-03-29: qty 3, 3d supply, fill #0

## 2024-03-29 NOTE — Patient Instructions (Signed)
 I would like to thank Claudene Rayfield HERO, MD and Miriam Rocky Shams, Md 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   She has a remote history of a 2 pre syncopal events , 20 minutes after a Botox - not a  seizure like event but it was declared.   Risk factors for OSA were present,  including : Body mass index is 48.73 kg/m., neck size and upper airway anatomy.     1) the main risk factor for a worsening of the previously mild sleep apnea will lie in the weight gain that the patient has experienced.  With the current BMI she is at risk of obesity hypoventilation as well as at risk of obstructive sleep apnea.  I would very much prefer to have a new baseline study it may be easier for the mother of a young child to have this as a home sleep test, however it is often more sensible to create a split-night polysomnography have the patient sleep 2 hours and then titrated to CPAP to see if the new pressure range is needed.  Mrs. Trueba is able to arrange for a night in the sleep lab if necessary.   She can't sleep without it ! She needs CPAP.  She also noticed that at night without CPAP will increase her intracranial pressure will increase her headaches probably also mediates some not insignificant hypoxia.  She has been 100% compliant on her current machine again this is actually set between 5 and 20 cm water so this is manufacturer settings there were never reset the 95th percentile pressure is 10.3 cm water which is a very common middle range.  The residual AHI is only 1.8 so even for mild apnea she has achieved a good reduction showed her baseline being moderate to severe she has definitely achieved good reduction.  There are no central apneas arising.  There is almost no air leakage so the current mask is what she should continue using.     The machine is 34 years old now.  I was  unable to decipher the faxed report from [facility].  It seems that the AHI may have been 6.5 with a desaturation to 87% oxygen in 2016 this would not be a significant AHI it was not a significant hypoxia degree but the patient did snore rather loudly.  And with her documented hypertension and idiopathic intracranial hypertension continuation of CPAP is definitely needed.      My Plan is to proceed with:    SPLIT study  or CPAP re-titration? Afraid  that she can't initiate sleep without CAP on.  Provide Xanax   0.5 mg po for the night of the study.    1)  SPLIT at AHI 10/h.  Bring your mask as we may not have them.  2)  consider Mounjaro/ zepbound per weight loss clinic.  3)  please make this patient first arrival time and offer bed 2.      I plan to follow up personally within the next 3-5 months /through our NP within 3-5 months.

## 2024-03-29 NOTE — Progress Notes (Signed)
 Provider:  Dedra Gores, MD  Primary Care Physician:  Claudene Rayfield HERO, MD [address] [place] [address]  Referring Provider: Miriam Rocky Shams, Md [address] [place],  [place] [address]        Chief Concern for this Consultation:   Patient presents with          HPI: I have the pleasure of meeting with Carol Porter, on 03-29-2024, who is a 33 y.o. Caucasian married  female patient,  seen upon a referral by PCP on  Sleep Medicine Consultation.  The patient's referral information asked for a New evaluation of OSA, and new therapy approach.   Chief concern according to patient:   has a past medical history of Allergy , Anxiety, Asthma, Depression, Idiopathic intracranial hypertension, and Shingles.. Sleep relevant medical/ surgical and symptom history: The patient reports that she had a HST in 2016 @ [facility], resulting in a  dx of mild OSA ( 6.9 AHI )  She was meanwhile dx with IIH, gained 50 pounds, and still uses her CPAP from 2021, the second CPAP-  with a setting from 5-15 cm water.  IIH  was treated with 2 stents, she reports - 2021.  She will see today for the first time a weight management specialist at [facility]>   Hx PTSD, Trauma such as TBI/ whiplash, Concussion-  Anemia, GERD,  Mood disorders: Anxiety and  Depression. Cardiac palpitations.   This patient had a previous sleep study in 2016 at [facility], HST was positive for mild OSA . This patient has used the following therapies: CPAP auto pap  5 -1 5 cm water.    Family medical history: Father and mother are affected by Sleep apnea ( on CPAP ).    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  and has 2 dogs. Commutes one hour.   Nicotine use: /.  ETOH use: /,  Caffeine intake is rare .  Hobbies : Farming. [employer].     Sleep habits and routines are as  follows: The patient's dinner time is around 6 PM.  Evening time is spent by TV . The patient goes to bed at, or close to, 11.30 PM. The bedroom is shared with spouse and is described as cool, quiet, and dark. The patient reports that it takes 20 minutes to fall asleep, then continues to sleep for 7 hours,  woken up by unknown factors- , not by the need to void (Nocturia).   The preferred sleep position is lateral and supine , with support of 1 pillow, on an adjustable bed. The total estimated sleep time is circa 7 hours.  Dreams are reportedly  frequent and can be vivid. Dream enactment has not been reported.   7.20 AM is the usual week- day rise time. The patient wakes up  with two alarms set at 7 and , 7.10 AM .  Mrs. Rauh reports mostly feeling unrefreshed and unrestored in the morning, waking with symptoms such as morning headaches, stiffness or pain, and fatigue.  A single event  of sleep paralysis has been experienced.   Naps in daytime are taken on week ends frequently (there is a desire to nap and has opportunity), lasting from 1-2  hours and have a refreshing quality. These do  interfere with nocturnal sleep.    Review  of Systems: Out of a complete 14 system review, the patient complains of only the following symptoms, and all other reviewed systems are negative.:  Hypersomnia   Insomnia after long naps   Depression/ anxiety:  Pain: Chronic  joint and muscle pain   Headaches  AM     Snoring, Sleep fragmentation, Nocturia   How likely are you to doze in the following situations: 0 = not likely, 1 = slight chance, 2 = moderate chance, 3 = high chance Sitting and Reading? Watching Television? Sitting inactive in a public place (theater or meeting)? As a passenger in a car for an hour without a break? Lying down in the afternoon when circumstances permit? Sitting and talking to someone? Sitting quietly after lunch without alcohol? In a car, while stopped for a few minutes in  traffic?   Total ESS =3 / 24 points.    FSS endorsed at 56/ 63 points.   Obesity, concerning for PCOS>  Social History   Socioeconomic History   Marital status: Single    Spouse name: Not on file   Number of children: Adopted daughter, 3.31 years old as of 03-29-2024   Years of education: Not on file   Highest education level: PA school   Occupational History   Not on file  Tobacco Use   Smoking status: Never   Smokeless tobacco: Never  Vaping Use   Vaping status: Never Used  Substance and Sexual Activity   Alcohol use: Not Currently    Comment: social   Drug use: No   Sexual activity: Yes    Birth control/protection: None  Other Topics Concern   Not on file  Social History Narrative   Not on file   Social Drivers of Health   Financial Resource Strain: Medium Risk (02/27/2023)   Received from [facility]   Overall Financial Resource Strain (CARDIA)    Difficulty of Paying Living Expenses: Somewhat hard  Food Insecurity: No Food Insecurity (03/16/2024)   Hunger Vital Sign    Worried About Running Out of Food in the Last Year: Never true    Ran Out of Food in the Last Year: Never true  Transportation Needs: No Transportation Needs (02/27/2023)   [facility] - Transportation    In the past 12 months, has lack of transportation kept you from medical appointments or from getting medications?: No    Lack of Transportation (Non-Medical): No  Physical Activity: Insufficiently Active (06/22/2019)   Received from [facility]   Exercise Vital Sign    On average, how many days per week do you engage in moderate to strenuous exercise (like a brisk walk)?: 1 day    On average, how many minutes do you engage in exercise at this level?: 30 min  Stress: No Stress Concern Present (06/22/2019)   Received from [facility] - Occupational Stress  Questionnaire    Feeling of Stress : Not at all  Social Connections: Moderately Isolated (06/22/2019)   Received from [facility]   Social Connection and Isolation Panel    In a typical week, how many times do you talk on the phone with family, friends, or neighbors?: Three times a week    How often do you get together with friends or relatives?: Once a week    How often do you attend church or religious services?: Never    Do  you belong to any clubs or organizations such as church groups, unions, fraternal or athletic groups, or school groups?: No    How often do you attend meetings of the clubs or organizations you belong to?: Never    Are you married, widowed, divorced, separated, never married, or living with a partner?: Married    No family history on file.  Past Medical History:  Diagnosis Date   Allergy     Anxiety    Asthma    Depression    Idiopathic intracranial hypertension    Shingles     Past Surgical History:  Procedure Laterality Date   LUMBAR PUNCTURE       Current Outpatient Medications on File Prior to Visit  Medication Sig Dispense Refill   cyanocobalamin  (VITAMIN B12) 1000 MCG/ML injection Inject 1 mL (1,000 mcg total) into the muscle every 30 (thirty) days. 3 mL 3   EPINEPHrine  0.3 mg/0.3 mL IJ SOAJ injection Inject 0.3 mg into the muscle as needed for anaphylaxis. 2 each 1   ergocalciferol  (VITAMIN D2) 1.25 MG (50000 UT) capsule Take 1 capsule (50,000 Units total) by mouth once a week. 12 capsule 3   famotidine (PEPCID) 20 MG tablet Take by mouth. (Patient taking differently: Take 20 mg by mouth at bedtime.)     FLUoxetine  (PROZAC ) 20 MG tablet Take 20 mg by mouth daily.     furosemide  (LASIX ) 20 MG tablet Take 1 tablet (20 mg total) by mouth 2 (two)  times daily as needed. 60 tablet 1   hydrocortisone  (ANUSOL -HC) 25 MG suppository Place 1 suppository (25 mg total) rectally 2 (two) times daily as needed for Hemorrhoids 20 suppository 3    hydrocortisone  (ANUSOL -HC) 25 MG suppository Place 1 suppository (25 mg total) rectally 2 (two) times daily as needed for Hemorrhoids 20 suppository 3   Iron -FA-B Cmp-C-Biot-Probiotic (FUSION PLUS) CAPS Take 1 capsule by mouth daily. 90 capsule 3   levalbuterol  (XOPENEX  HFA) 45 MCG/ACT inhaler Inhale 2 inhalations into the lungs every 6 (six) hours as needed for Wheezing 15 g 2   Magnesium Bisglycinate (MAG GLYCINATE) 100 MG TABS Take by mouth daily.     Multiple Vitamins-Iron  (MULTI-VITAMIN/IRON  PO) Take by mouth daily.     SYRINGE-NEEDLE, DISP, 3 ML (B-D 3CC LUER-LOK SYR 25GX1) 25G X 1 3 ML MISC Inject 1 each into the muscle every 30 (thirty) days. 50 each 1   triamcinolone  (NASACORT ) 55 MCG/ACT AERO nasal inhaler Use 2 sprays in each nostril once daily 16.9 mL 12   No current facility-administered medications on file prior to visit.    Allergies  Allergen Reactions   Omeprazole Hives, Itching, Rash and Other (See Comments)    Vitals:   03/29/24 0914  BP: 112/81  Pulse: 91      Physical exam:   General: The patient was alert and appears not in acute distress.  Mood and affect are appropriate .  The patient's interactions are: Cooperative, makes eye contact, follows the instructions and answers questions coherently.  The patient is groomed and appropriately groomed and dressed. Head: Normocephalic, atraumatic.  Neck is supple. Mallampati: 3.  The neck circumference measured 17 inches. Nasal airflow was partially patent ,   Overbite was noted.  Dental status: biological  Cardiovascular:  Regular rate and cardiac rhythm by palpable pulse. Respiratory: no audible wheezing, no tachypnoea.   Skin:  Without evidence of ankle edema. No discoloration.  Trunk:  BMI -   The patient's posture was erect.   Neurologic exam :  The patient was awake and alert, oriented to place and time.   Attention span & concentration ability appeared normal.  Speech was fluent, without dysarthria,  dysphonia or aphasia, and of normal volume.     Cranial nerves:  There was no loss of smell or taste reported  Pupils are round, equal in size and briskly reactive to light.  Funduscopic exam was deferred..  Extraocular movements in vertical and horizontal planes were intact and without nystagmus. (No Diplopia reported). Visual fields by finger perimetry are intact. Hearing was intact to soft voice.    Facial sensation intact to fine touch.  Facial motor strength: Symmetric movement and tongue and uvula move midline.  Neck ROM: rotation, tilt and flexion extension were intact for age and shoulder shrug was symmetrical.    Motor exam:  Symmetric bulk, strength and ROM.   Normal tone without cog- wheeling, and symmetric grip strength.   Sensory:  Fine touch and vibration were tested by tuning fork and intact.  Proprioception tested in the upper extremities was normal.   Coordination: The patient reported no problems with button closure and no changes to penmanship.   The Finger-to-nose maneuver was intact without evidence of ataxia, dysmetria or tremor.   Gait and station: Patient could rise unassisted from a seated position, without bracing, and walked without assistive device.  Stance was of normal/ wide width. The patient turned with 3 steps.   Deep tendon reflexes: Upper extremities did show symmetric DTRs. Lower extremity DTRs were symmetric and brisk/ attenuated.   Babinski response was deferred.   I would like to thank Claudene Rayfield HERO, MD and Miriam Rocky Shams, Md [address] [place],  [place] [address] for allowing me to meet with Lauraine Dais, PA, who has been treated with PAP therapy for the lat 9 years.  She has a remote history of a 2 pre syncopal events , 20 minutes after a Botox - not a  seizure like event but it was declared.  Risk factors for OSA were present,  including : Body mass index is 48.73 kg/m., neck size and upper airway anatomy.   1)  the main risk factor for a worsening of the previously mild sleep apnea will lie in the weight gain that the patient has experienced.  With the current BMI she is at risk of obesity hypoventilation as well as at risk of obstructive sleep apnea.  I would very much prefer to have a new baseline study it may be easier for the mother of a young child to have this as a home sleep test, however it is often more sensible to create a split-night polysomnography have the patient sleep 2 hours and then titrated to CPAP to see if the new pressure range is needed.  Mrs. Turrubiates is able to arrange for a night in the sleep lab if necessary.  She can't sleep without it ! She needs CPAP.  She also noticed that at night without CPAP will increase her intracranial pressure will increase her headaches probably also mediates some not insignificant hypoxia.  She has been 100% compliant on her current machine again this is actually set between 5 and 20 cm water so this is manufacturer settings there were never reset the 95th percentile pressure is 10.3 cm water which is a very common middle range.  The residual AHI is only 1.8 so even for mild apnea she has achieved a good reduction showed her baseline being moderate to severe she has definitely  achieved good reduction.  There are no central apneas arising.  There is almost no air leakage so the current mask is what she should continue using.    The machine is 34 years old now.  I was unable to decipher the faxed report from [facility] pulmonology.  It seems that the AHI may have been 6.5 with a desaturation to 87% oxygen in 2016 this would not be a significant AHI it was not a significant hypoxia degree but the patient did snore rather loudly.  And with her documented hypertension and idiopathic intracranial hypertension continuation of CPAP is definitely needed.     My Plan is to proceed with:   SPLIT study  or CPAP re-titration? Afraid  that she can't initiate sleep  without CAP on.  Provide Xanax   0.5 mg po for the night of the study.   1)  SPLIT at AHI 10/h.  Bring your mask as we may not have them.  2)  consider Mounjaro/ zepbound per weight loss clinic.  3)  please make this patient first arrival time and offer bed 2.    I plan to follow up personally within the next 3-5 months /through our NP within 3-5 months.   A total time of  45  minutes consistent of a part of face to face encounter , exam and interview,  and additional preparation time for chart review was spent .  At today's visit, we discussed treatment options, associated risk and benefits, and engage in counseling as needed including, but not limited to:  Sleep hygiene, Quality Sleep Habits, and Safety concerns for patients with daytime sleepiness who are warned to not operate machinery/ motor vehicles when drowsy.   Additionally, the following were reviewed: Past medical records, past medical and surgical history, family and social background, as well as relevant laboratory results, imaging findings, and medical notes, where applicable.  This note was generated by myself in part by using dictation software, and as a result, it may contain unintentional typos and errors.  Nevertheless, effort was made to accurately convey the pertinent aspects of the patient's visit.   Dedra Gores, MD  [facility] Sleep Board certified in Sleep Medicine by The [employer] of Sleep Medicine and Diplomate of the [employer] of [employer] (AASM) . Board certified In Neurology, Diplomat of the ABPN,  Fellow of the [employer] of Neu[employer].

## 2024-04-11 ENCOUNTER — Telehealth: Payer: Self-pay | Admitting: Neurology

## 2024-04-11 NOTE — Telephone Encounter (Signed)
 04/11/24 sent mychart EE  03/30/24 [facility] [place] 03/29/24 [facility] aetna no auth req EE

## 2024-04-15 ENCOUNTER — Ambulatory Visit: Admitting: Internal Medicine

## 2024-04-15 DIAGNOSIS — Z6841 Body Mass Index (BMI) 40.0 and over, adult: Secondary | ICD-10-CM | POA: Diagnosis not present

## 2024-04-15 DIAGNOSIS — E282 Polycystic ovarian syndrome: Secondary | ICD-10-CM | POA: Diagnosis not present

## 2024-04-15 DIAGNOSIS — E66813 Obesity, class 3: Secondary | ICD-10-CM | POA: Diagnosis not present

## 2024-04-18 NOTE — Telephone Encounter (Signed)
 I called the patient to call her SS. She did not pick up I left her a Engineer, technical sales and sent mychart.

## 2024-04-29 ENCOUNTER — Ambulatory Visit: Attending: Cardiology | Admitting: Cardiology

## 2024-04-29 ENCOUNTER — Encounter: Payer: Self-pay | Admitting: Cardiology

## 2024-04-29 VITALS — BP 110/78 | HR 76 | Ht 69.0 in | Wt 329.8 lb

## 2024-04-29 DIAGNOSIS — G901 Familial dysautonomia [Riley-Day]: Secondary | ICD-10-CM

## 2024-04-29 DIAGNOSIS — R002 Palpitations: Secondary | ICD-10-CM | POA: Diagnosis not present

## 2024-04-29 NOTE — Progress Notes (Signed)
 Electrophysiology Clinic Note    Date:  04/29/2024  Patient ID:  Carol Porter, DOB 03/16/1991, MRN [id number] PCP:  Claudene Rayfield HERO, MD  Cardiologist:  None      Discussed the use of AI scribe software for clinical note transcription with the patient, who gave verbal consent to proceed.   Patient Profile    Chief Complaint: dysautonomia follow-up  History of Present Illness: Carol Porter is a 33 y.o. female with PMH notable for dysautonomia, OSA on CPAP, idiopathic intracranial HTN, depression, anxiety ; seen today for routine electrophysiology followup.   She last saw Dr. Fernande 04/2023 where she had ortho HTN, so recommended to cut back on salt intake, but was overall doing well.   On follow-up today, she is doing very well. She has sporadic chest discomfort episodes that are brief in duration and she believes they are associated with anxiety.  Separately, she has brief flip-flopping or hiccupping sensation that are very brief, these have happened twice and always associated with a resp cold.   She denies chest pain, chest pressure, dizziness, presyncope.     Arrhythmia/Device History No specialty comments available.    ROS:  Please see the history of present illness. All other systems are reviewed and otherwise negative.    Physical Exam    VS:  BP 110/78   Pulse 76   Ht 5' 9 (1.753 m)   Wt (!) 329 lb 12.8 oz (149.6 kg)   SpO2 99%   BMI 48.70 kg/m  BMI: Body mass index is 48.7 kg/m.  Orthostatic VS for the past 24 hrs (Last 3 readings):  BP- Lying Pulse- Lying BP- Sitting Pulse- Sitting BP- Standing at 0 minutes Pulse- Standing at 0 minutes BP- Standing at 3 minutes Pulse- Standing at 3 minutes  04/29/24 1114 114/77 87 125/81 88 120/54 93 96/57 84          Wt Readings from Last 3 Encounters:  04/29/24 (!) 329 lb 12.8 oz (149.6 kg)  03/29/24 (!) 330 lb (149.7 kg)  03/16/24 (!) 330 lb 1.9 oz (149.7 kg)     GEN- The patient is well appearing,  alert and oriented x 3 today.   Lungs- Clear to ausculation bilaterally, normal work of breathing.  Heart- Regular rate and rhythm, no murmurs, rubs or gallops Extremities- No peripheral edema, warm, dry   Studies Reviewed   Previous EP, cardiology notes.    EKG is ordered. Personal review of EKG from today shows:    EKG Interpretation Date/Time:  Friday April 29 2024 11:07:44 EDT Ventricular Rate:  79 PR Interval:  130 QRS Duration:  90 QT Interval:  400 QTC Calculation: 458 R Axis:   48  Text Interpretation: Normal sinus rhythm Possible Left atrial enlargement Nonspecific ST and T wave abnormality Confirmed by Shamya Macfadden [phone]) on 04/29/2024 12:56:26 PM      TTE, 11/04/2017 NORMAL LEFT VENTRICULAR SYSTOLIC FUNCTION  NORMAL RIGHT VENTRICULAR SYSTOLIC FUNCTION  TRIVIAL REGURGITATION NOTED (See above)  NO VALVULAR STENOSIS  NEGATIVE BUBBLE STUDY (IMAGE 53)    Assessment and Plan     #) dysautonomia #) palpitations Symptoms well-controlled off medications She is having brief palpitation episodes, either with upper resp cold or with anxiety      Current medicines are reviewed at length with the patient today.   The patient does not have concerns regarding her medicines.  The following changes were made today:  none  Labs/ tests ordered today include:  Orders Placed  This Encounter  Procedures   EKG 12-Lead   EKG 12-Lead     Disposition: Follow up with EP Team PRN    Signed, Chantal Needle, NP  04/29/24  3:34 PM  Electrophysiology [facility]

## 2024-04-29 NOTE — Progress Notes (Signed)
 Order(s) created erroneously. Erroneous order ID: [id number]  Order moved by: CHART CORRECTION ANALYST SEVEN, IDENTITY  Order move date/time: 04/29/2024 6:48 PM  Source Patient: [id number]  Source Contact: 04/29/2024  Destination Patient: [id number]  Destination Contact: 01/07/2023

## 2024-04-29 NOTE — Patient Instructions (Signed)
 Medication Instructions:  Your physician recommends that you continue on your current medications as directed. Please refer to the Current Medication list given to you today.   *If you need a refill on your cardiac medications before your next appointment, please call your pharmacy*  Lab Work: No labs ordered today  If you have labs (blood work) drawn today and your tests are completely normal, you will receive your results only by: MyChart Message (if you have MyChart) OR A paper copy in the mail If you have any lab test that is abnormal or we need to change your treatment, we will call you to review the results.  Testing/Procedures: No test ordered today   Follow-Up: At [facility], you and your health needs are our priority.  As part of our continuing mission to provide you with exceptional heart care, our providers are all part of one team.  This team includes your primary Cardiologist (physician) and Advanced Practice Providers or APPs (Physician Assistants and Nurse Practitioners) who all work together to provide you with the care you need, when you need it.  Your next appointment:   Follow up as needed.   Provider:   Suzann Riddle, NP    We recommend signing up for the patient portal called MyChart.  Sign up information is provided on this After Visit Summary.  MyChart is used to connect with patients for Virtual Visits (Telemedicine).  Patients are able to view lab/test results, encounter notes, upcoming appointments, etc.  Non-urgent messages can be sent to your provider as well.   To learn more about what you can do with MyChart, go to [email or web address].

## 2024-05-05 ENCOUNTER — Other Ambulatory Visit (HOSPITAL_BASED_OUTPATIENT_CLINIC_OR_DEPARTMENT_OTHER): Payer: Self-pay

## 2024-05-10 ENCOUNTER — Other Ambulatory Visit (HOSPITAL_BASED_OUTPATIENT_CLINIC_OR_DEPARTMENT_OTHER): Payer: Self-pay

## 2024-05-11 ENCOUNTER — Encounter

## 2024-05-12 ENCOUNTER — Encounter

## 2024-05-12 ENCOUNTER — Other Ambulatory Visit (HOSPITAL_BASED_OUTPATIENT_CLINIC_OR_DEPARTMENT_OTHER): Payer: Self-pay

## 2024-05-16 ENCOUNTER — Other Ambulatory Visit (HOSPITAL_BASED_OUTPATIENT_CLINIC_OR_DEPARTMENT_OTHER): Payer: Self-pay

## 2024-05-17 ENCOUNTER — Other Ambulatory Visit (HOSPITAL_BASED_OUTPATIENT_CLINIC_OR_DEPARTMENT_OTHER): Payer: Self-pay

## 2024-05-17 MED ORDER — FLUOXETINE HCL 20 MG PO TABS
ORAL_TABLET | ORAL | 0 refills | Status: DC
  Filled 2024-05-17: qty 30, 30d supply, fill #0

## 2024-05-17 MED ORDER — FLUOXETINE HCL 20 MG PO TABS
20.0000 mg | ORAL_TABLET | Freq: Every day | ORAL | 11 refills | Status: AC
  Filled 2024-05-17 – 2024-05-18 (×2): qty 30, 30d supply, fill #0
  Filled 2024-06-08 – 2024-06-13 (×4): qty 30, 30d supply, fill #1

## 2024-05-18 ENCOUNTER — Other Ambulatory Visit (HOSPITAL_BASED_OUTPATIENT_CLINIC_OR_DEPARTMENT_OTHER): Payer: Self-pay

## 2024-05-18 ENCOUNTER — Other Ambulatory Visit: Payer: Self-pay | Admitting: Internal Medicine

## 2024-05-19 ENCOUNTER — Other Ambulatory Visit: Payer: Self-pay

## 2024-05-19 ENCOUNTER — Other Ambulatory Visit (HOSPITAL_COMMUNITY)
Admission: RE | Admit: 2024-05-19 | Discharge: 2024-05-19 | Disposition: A | Source: Ambulatory Visit | Attending: Family Medicine | Admitting: Family Medicine

## 2024-05-19 ENCOUNTER — Other Ambulatory Visit (HOSPITAL_BASED_OUTPATIENT_CLINIC_OR_DEPARTMENT_OTHER): Payer: Self-pay

## 2024-05-19 ENCOUNTER — Ambulatory Visit (INDEPENDENT_AMBULATORY_CARE_PROVIDER_SITE_OTHER): Admitting: Family Medicine

## 2024-05-19 VITALS — BP 136/58 | HR 84 | Wt 329.0 lb

## 2024-05-19 DIAGNOSIS — N898 Other specified noninflammatory disorders of vagina: Secondary | ICD-10-CM

## 2024-05-19 MED ORDER — EPINEPHRINE 0.3 MG/0.3ML IJ SOAJ
0.3000 mg | INTRAMUSCULAR | 1 refills | Status: AC | PRN
  Filled 2024-05-19 – 2024-06-13 (×2): qty 2, 1d supply, fill #0

## 2024-05-20 ENCOUNTER — Other Ambulatory Visit (HOSPITAL_BASED_OUTPATIENT_CLINIC_OR_DEPARTMENT_OTHER): Payer: Self-pay

## 2024-05-20 ENCOUNTER — Encounter: Payer: Self-pay | Admitting: Family Medicine

## 2024-05-20 LAB — CERVICOVAGINAL ANCILLARY ONLY
Bacterial Vaginitis (gardnerella): POSITIVE — AB
Candida Glabrata: NEGATIVE
Candida Vaginitis: NEGATIVE
Comment: NEGATIVE
Comment: NEGATIVE
Comment: NEGATIVE

## 2024-05-20 LAB — HEMOGLOBIN A1C
Est. average glucose Bld gHb Est-mCnc: 117 mg/dL
Hgb A1c MFr Bld: 5.7 % — ABNORMAL HIGH (ref 4.8–5.6)

## 2024-05-20 MED ORDER — METRONIDAZOLE 500 MG PO TABS
500.0000 mg | ORAL_TABLET | Freq: Two times a day (BID) | ORAL | 0 refills | Status: AC
  Filled 2024-05-20: qty 14, 7d supply, fill #0

## 2024-05-20 NOTE — Progress Notes (Signed)
 Acute Office Visit  Subjective:     Patient ID: Carol Porter, female    DOB: Feb 02, 1991, 33 y.o.   MRN: [id number]  No chief complaint on file.   HPI  Discussed the use of AI scribe software for clinical note transcription with the patient, who gave verbal consent to proceed.  History of Present Illness Carol Porter is a 33 year old female who presents with irritation and itching in the perineal area.  For the past two weeks, she has experienced consistent irritation and itching in the perineal area, described as feeling like 'paper cuts' between the introitus and anus. The symptoms are exacerbated during bathroom use, which she describes as 'excruciating', but she feels fine when walking or doing normal activities. She denies extra sweating or moisture. She reports no vaginal discharge.  She denies any new medications other than those for iron . She has attempted to manage the symptoms by switching to more gentle soaps, but this has not alleviated the irritation. She has also noted that oral iron  supplements previously caused rectal irritation and constipation, but she has been off them for a while.  Her mother has a history of lichen sclerosus, which is relevant given her current symptoms.  Regarding her gynecological history, she had an abnormal Pap smear at age 19-18, which was not high risk and cleared. A few years ago, she had another abnormal Pap with high risk HPV, which also cleared. She is concerned about her screening due to a mutual partner of her current partner having cervical cancer. Her last Pap smear was normal, and she has received the Gardasil vaccine.     ROS Per HPI      Objective:    BP (!) 136/58 (BP Location: Left Arm, Patient Position: Sitting, Cuff Size: Large)   Pulse 84   Wt (!) 329 lb (149.2 kg)   LMP 04/27/2024 (Exact Date)   BMI 48.58 kg/m    Physical Exam Vitals reviewed. Exam conducted with a chaperone present.  Constitutional:       Appearance: Normal appearance.  Genitourinary:    Exam position: Supine.     Labia:        Right: No rash or tenderness.        Left: No rash, tenderness or lesion.      Comments: Thin grey discharge. No tears or lacerations of the perineum.  Skin:    Capillary Refill: Capillary refill takes less than 2 seconds.  Neurological:     General: No focal deficit present.     Mental Status: She is alert.  Psychiatric:        Mood and Affect: Mood normal.        Behavior: Behavior normal.        Thought Content: Thought content normal.        Judgment: Judgment normal.     Results for orders placed or performed in visit on 05/19/24  Hemoglobin A1c  Result Value Ref Range   Hgb A1c MFr Bld 5.7 (H) 4.8 - 5.6 %   Est. average glucose Bld gHb Est-mCnc 117 mg/dL  Cervicovaginal ancillary only( [place])  Result Value Ref Range   Bacterial Vaginitis (gardnerella) Positive (A)    Candida Vaginitis Negative    Candida Glabrata Negative    Comment      Normal Reference Range Bacterial Vaginosis - Negative   Comment Normal Reference Range Candida Species - Negative    Comment Normal Reference Range Candida Galbrata - Negative  Assessment & Plan:   Assessment and Plan Assessment & Plan Vulvar and perineal irritation with pruritus and pain Intermittent irritation with pruritus and pain, exacerbated by urination. Differential includes irritation from oral iron , soap sensitivity, or dermatological conditions. No history of lichen sclerosus despite family history. - Perform physical examination for lesions or discharge. - Collect discharge sample for lab analysis.  Possible vulvovaginal candidiasis Considered due to slight discharge and external irritation. Yeast infection could be subclinical. Discussed sugar's role in yeast growth and potential for subclinical irritation. - Recommend OTC clotrimazole cream, apply pea-sized amount twice daily over weekend. - Monitor symptoms for  improvement. - Await discharge analysis results.  Cervical cancer screening and HPV concerns Discussed cervical cancer screening history and HPV concerns. Last Pap smear normal. Gardasil vaccine received, reducing cervical cancer risk. Explained high-risk HPV does not necessarily lead to cervical cancer. - Schedule Pap smear during annual exam. - Perform hemoglobin A1c test to monitor glucose levels.     Orders Placed This Encounter  Procedures   Hemoglobin A1c     No orders of the defined types were placed in this encounter.   No follow-ups on file.  Leolia Vinzant J Irvin Bastin, DO   Addendum: 05/20/2024 4:31 PM Prescription for flagyl for BV sent.  Nathen Balaban J Velmer Woelfel, DO

## 2024-05-23 ENCOUNTER — Ambulatory Visit

## 2024-05-23 ENCOUNTER — Other Ambulatory Visit (HOSPITAL_BASED_OUTPATIENT_CLINIC_OR_DEPARTMENT_OTHER): Payer: Self-pay | Admitting: Neurology

## 2024-05-23 DIAGNOSIS — R569 Unspecified convulsions: Secondary | ICD-10-CM

## 2024-05-23 DIAGNOSIS — G43719 Chronic migraine without aura, intractable, without status migrainosus: Secondary | ICD-10-CM

## 2024-05-23 MED ORDER — GADOBUTROL 1 MMOL/ML IV SOLN: 10.0000 mL | Freq: Once | INTRAVENOUS | Status: AC | PRN

## 2024-05-30 ENCOUNTER — Other Ambulatory Visit (HOSPITAL_BASED_OUTPATIENT_CLINIC_OR_DEPARTMENT_OTHER): Payer: Self-pay

## 2024-06-08 ENCOUNTER — Other Ambulatory Visit (HOSPITAL_BASED_OUTPATIENT_CLINIC_OR_DEPARTMENT_OTHER): Payer: Self-pay

## 2024-06-08 DIAGNOSIS — G4733 Obstructive sleep apnea (adult) (pediatric): Secondary | ICD-10-CM | POA: Diagnosis not present

## 2024-06-10 ENCOUNTER — Other Ambulatory Visit (HOSPITAL_BASED_OUTPATIENT_CLINIC_OR_DEPARTMENT_OTHER): Payer: Self-pay

## 2024-06-10 ENCOUNTER — Other Ambulatory Visit: Payer: Self-pay

## 2024-06-10 DIAGNOSIS — F411 Generalized anxiety disorder: Secondary | ICD-10-CM | POA: Diagnosis not present

## 2024-06-10 DIAGNOSIS — Z1330 Encounter for screening examination for mental health and behavioral disorders, unspecified: Secondary | ICD-10-CM | POA: Diagnosis not present

## 2024-06-10 DIAGNOSIS — Z23 Encounter for immunization: Secondary | ICD-10-CM | POA: Diagnosis not present

## 2024-06-10 DIAGNOSIS — G901 Familial dysautonomia [Riley-Day]: Secondary | ICD-10-CM | POA: Diagnosis not present

## 2024-06-10 DIAGNOSIS — G4733 Obstructive sleep apnea (adult) (pediatric): Secondary | ICD-10-CM | POA: Diagnosis not present

## 2024-06-10 DIAGNOSIS — R234 Changes in skin texture: Secondary | ICD-10-CM | POA: Diagnosis not present

## 2024-06-10 DIAGNOSIS — Z1331 Encounter for screening for depression: Secondary | ICD-10-CM | POA: Diagnosis not present

## 2024-06-10 DIAGNOSIS — Z6841 Body Mass Index (BMI) 40.0 and over, adult: Secondary | ICD-10-CM | POA: Diagnosis not present

## 2024-06-10 MED ORDER — FLUOXETINE HCL (PMDD) 10 MG PO TABS
10.0000 mg | ORAL_TABLET | Freq: Every day | ORAL | 4 refills | Status: AC
  Filled 2024-06-10: qty 180, 90d supply, fill #0

## 2024-06-10 MED ORDER — TERCONAZOLE 0.4 % VA CREA
1.0000 | TOPICAL_CREAM | Freq: Every day | VAGINAL | 0 refills | Status: AC
  Filled 2024-06-10: qty 45, 7d supply, fill #0

## 2024-06-13 ENCOUNTER — Other Ambulatory Visit (HOSPITAL_BASED_OUTPATIENT_CLINIC_OR_DEPARTMENT_OTHER): Payer: Self-pay

## 2024-06-13 ENCOUNTER — Other Ambulatory Visit: Payer: Self-pay

## 2024-06-13 ENCOUNTER — Other Ambulatory Visit (HOSPITAL_COMMUNITY): Payer: Self-pay

## 2024-06-14 ENCOUNTER — Other Ambulatory Visit (HOSPITAL_BASED_OUTPATIENT_CLINIC_OR_DEPARTMENT_OTHER): Payer: Self-pay

## 2024-06-17 DIAGNOSIS — G4733 Obstructive sleep apnea (adult) (pediatric): Secondary | ICD-10-CM | POA: Diagnosis not present

## 2024-06-17 DIAGNOSIS — G932 Benign intracranial hypertension: Secondary | ICD-10-CM | POA: Diagnosis not present

## 2024-06-17 DIAGNOSIS — R7303 Prediabetes: Secondary | ICD-10-CM | POA: Diagnosis not present

## 2024-06-17 DIAGNOSIS — E282 Polycystic ovarian syndrome: Secondary | ICD-10-CM | POA: Diagnosis not present

## 2024-06-17 DIAGNOSIS — Z6841 Body Mass Index (BMI) 40.0 and over, adult: Secondary | ICD-10-CM | POA: Diagnosis not present

## 2024-06-17 DIAGNOSIS — E66813 Obesity, class 3: Secondary | ICD-10-CM | POA: Diagnosis not present

## 2024-06-20 ENCOUNTER — Other Ambulatory Visit: Payer: Self-pay

## 2024-06-20 ENCOUNTER — Encounter: Payer: Self-pay | Admitting: Internal Medicine

## 2024-06-20 ENCOUNTER — Ambulatory Visit: Admitting: Internal Medicine

## 2024-06-20 VITALS — BP 128/76 | HR 77 | Temp 98.5°F | Resp 20 | Wt 329.5 lb

## 2024-06-20 DIAGNOSIS — L293 Anogenital pruritus, unspecified: Secondary | ICD-10-CM

## 2024-06-20 DIAGNOSIS — L2084 Intrinsic (allergic) eczema: Secondary | ICD-10-CM | POA: Diagnosis not present

## 2024-06-20 DIAGNOSIS — T7802XD Anaphylactic reaction due to shellfish (crustaceans), subsequent encounter: Secondary | ICD-10-CM

## 2024-06-20 DIAGNOSIS — J3089 Other allergic rhinitis: Secondary | ICD-10-CM | POA: Diagnosis not present

## 2024-06-20 DIAGNOSIS — H1013 Acute atopic conjunctivitis, bilateral: Secondary | ICD-10-CM | POA: Diagnosis not present

## 2024-06-20 DIAGNOSIS — J4520 Mild intermittent asthma, uncomplicated: Secondary | ICD-10-CM | POA: Diagnosis not present

## 2024-06-20 DIAGNOSIS — J302 Other seasonal allergic rhinitis: Secondary | ICD-10-CM

## 2024-06-20 NOTE — Progress Notes (Signed)
 "  FOLLOW UP Date of Service/Encounter:  06/22/24  Subjective:  Carol Porter (DOB: 05/10/1991) is a 33 y.o. female who [facility] in re-evaluation of the following: Rhinitis, shellfish allergy , asthma, eczema History obtained from: chart review and patient.  For Review, LV was on  12/15/23 with Dr. Lorin seen for routine follow-up. See below for summary of history and diagnostics.   Today presents for follow-up. Discussed the use of AI scribe software for clinical note transcription with the patient, who gave verbal consent to proceed.  History of Present Illness Carol Porter is a 33 year old female who presents with a persistent anal-genital rash.  Perianal and genital pruritic rash - Persistent rash with intense pruritus primarily around the rectum, extending upwards into the genital area - Etiology remains undetermined despite consultations with multiple specialists - Recent use of suppositories to manage irritation - Use of various topical products due to suspicion of product sensitivity - Wearing pads has provided some symptomatic relief - Has seen gynecology, proctology without any good diagnosis or relief  Atopic and allergic history - History of well-controlled asthma and eczema - Known allergy  to shellfish and mollusks - Strict avoidance of shellfish and mollusks, resulting in improved gastrointestinal symptoms including diarrhea - No use of oral steroids such as prednisone  in the past month -Rare use of albuterol -Rhinitis control triamcinolone  nasal spray - Recent use of Pepcid for GERD which is well-controlled  Eczema symptoms have been well-controlled with emollients only except for rash described above     All medications reviewed by clinical staff and updated in chart. No new pertinent medical or surgical history except as noted in HPI.  ROS: All others negative except as noted per HPI.   Objective:  BP  128/76   Pulse 77   Temp 98.5 F (36.9 C) (Temporal)   Resp 20   Wt (!) 329 lb 8 oz (149.5 kg)   SpO2 99%   BMI 48.66 kg/m  Body mass index is 48.66 kg/m. Physical Exam: General Appearance:  Alert, cooperative, no distress, appears stated age  Head:  Normocephalic, without obvious abnormality, atraumatic  Eyes:  Conjunctiva clear, EOM's intact  Ears EACs normal bilaterally  Nose: Nares normal, normal mucosa, no visible anterior polyps, and septum midline  Throat: Lips, tongue normal; teeth and gums normal, normal posterior oropharynx  Neck: Supple, symmetrical  Lungs:   clear to auscultation bilaterally, Respirations unlabored, no coughing  Heart:  regular rate and rhythm and no murmur, Appears well perfused  Extremities: No edema  Skin: Skin color, texture, turgor normal and no rashes or lesions on visualized portions of skin; no visible rash in perianal region, mild hypopigmentation present  Neurologic: No gross deficits   Labs:  Lab Orders  No laboratory test(s) ordered today      Assessment/Plan   Patient Instructions  Allergy  test (12/15/2023): Positive to grass, weed, tree, mold, dust mite, roach, borderline to cat and dog;   positive to lobster and oyster Continue  avoidance measures  Seasonal allergic rhinitis, well controlled  - Continue triamcinolone  nasal spray as needed  - Consider cautious reintroduction of oral antihistamines if symptoms persist.  Shellfish allergy  - Avoid shellfish and mollusks - Continue to carry EpiPen  and follow food allergy  action plan - Recommend 2 years of no reactions and decreasing testing before consider reintroducing   Asthma, mild intermittent  Well-controlled with levalbuterol , except during viral infections. No recent hospitalizations or steroid use. -  Continue levalbuterol   2 puffs every 4 hours as needed. - Monitor for exacerbations during viral infections.  Chronic Eczema with anogenital itch (new)   - Continue  topical hydrocortisone  twice daily as needed - Recommend hypoallergenic hydrating ointment at least twice daily.  This must be done daily for control of flares. (Great options include Vaseline, CeraVe, Aquaphor, Aveeno, Cetaphil, VaniCream, etc) - Recommend avoiding detergents, soaps or lotions with fragrances/dyes, and instead using products which are hypoallergenic, use second rinse cycle when washing clothes -Wear lose breathable clothing, avoid wool -Avoid extremes of humidity - Limit showers/baths to 5 minutes and use luke warm water instead of hot, pat dry following baths, and apply moisturizer  For anogenital itch, recommend patch testing  Patches are best placed on Monday with return to office on Wednesday and Friday of same week for readings.  Patches once placed should not get wet.  You do not have to stop any medications for patch testing but should not be on oral prednisone . You can schedule a patch testing visit when convenient for your schedule.   Look at personal care products for possible shellfish/mollusk exposure     Follow up: patch testing   Thank you so much for letting me partake in your care today.  Don't hesitate to reach out if you have any additional concerns!  Hargis Springer, MD    Other:    Thank you so much for letting me partake in your care today.  Don't hesitate to reach out if you have any additional concerns!  Hargis Springer, MD  [facility] [place], [place]        "

## 2024-06-20 NOTE — Telephone Encounter (Signed)
 [facility] [employer] no auth req.  Patient is scheduled at [facility] for 07/26/24. Because the patient has [place] I was able to squeeze her in before the year is over. Other wise she will have to have an office visit with Dr. Chalice before having the sleep study if she has it in 2026.

## 2024-06-20 NOTE — Patient Instructions (Addendum)
 Allergy  test (12/15/2023): Positive to grass, weed, tree, mold, dust mite, roach, borderline to cat and dog;   positive to lobster and oyster Continue  avoidance measures  Seasonal allergic rhinitis, well controlled  - Continue triamcinolone  nasal spray as needed  - Consider cautious reintroduction of oral antihistamines if symptoms persist.  Shellfish allergy  - Avoid shellfish and mollusks - Continue to carry EpiPen  and follow food allergy  action plan - Recommend 2 years of no reactions and decreasing testing before consider reintroducing   Asthma, mild intermittent  Well-controlled with levalbuterol , except during viral infections. No recent hospitalizations or steroid use. - Continue levalbuterol   2 puffs every 4 hours as needed. - Monitor for exacerbations during viral infections.  Chronic Eczema with anogenital itch (new)   - Continue topical hydrocortisone  twice daily as needed - Recommend hypoallergenic hydrating ointment at least twice daily.  This must be done daily for control of flares. (Great options include Vaseline, CeraVe, Aquaphor, Aveeno, Cetaphil, VaniCream, etc) - Recommend avoiding detergents, soaps or lotions with fragrances/dyes, and instead using products which are hypoallergenic, use second rinse cycle when washing clothes -Wear lose breathable clothing, avoid wool -Avoid extremes of humidity - Limit showers/baths to 5 minutes and use luke warm water instead of hot, pat dry following baths, and apply moisturizer  For anogenital itch, recommend patch testing  Patches are best placed on Monday with return to office on Wednesday and Friday of same week for readings.  Patches once placed should not get wet.  You do not have to stop any medications for patch testing but should not be on oral prednisone . You can schedule a patch testing visit when convenient for your schedule.   Look at personal care products for possible shellfish/mollusk exposure     Follow up:  patch testing   Thank you so much for letting me partake in your care today.  Don't hesitate to reach out if you have any additional concerns!  Hargis Springer, MD

## 2024-06-21 ENCOUNTER — Other Ambulatory Visit: Payer: Self-pay

## 2024-06-21 ENCOUNTER — Other Ambulatory Visit (HOSPITAL_BASED_OUTPATIENT_CLINIC_OR_DEPARTMENT_OTHER): Payer: Self-pay

## 2024-06-21 MED ORDER — FLUOXETINE HCL 10 MG PO TABS
10.0000 mg | ORAL_TABLET | Freq: Every day | ORAL | 4 refills | Status: AC
  Filled 2024-06-21: qty 180, 90d supply, fill #0
  Filled 2024-08-02: qty 180, 90d supply, fill #1

## 2024-06-25 ENCOUNTER — Other Ambulatory Visit: Payer: Self-pay

## 2024-06-25 MED ORDER — FLUCONAZOLE 150 MG PO TABS
150.0000 mg | ORAL_TABLET | ORAL | 0 refills | Status: AC
  Filled 2024-06-25: qty 2, 3d supply, fill #0
  Filled 2024-06-27: qty 2, 6d supply, fill #0

## 2024-06-27 ENCOUNTER — Other Ambulatory Visit: Payer: Self-pay

## 2024-06-27 ENCOUNTER — Other Ambulatory Visit (HOSPITAL_BASED_OUTPATIENT_CLINIC_OR_DEPARTMENT_OTHER): Payer: Self-pay

## 2024-06-30 ENCOUNTER — Other Ambulatory Visit (HOSPITAL_COMMUNITY)
Admission: RE | Admit: 2024-06-30 | Discharge: 2024-06-30 | Disposition: A | Discharge status: Home / Self Care | Source: Ambulatory Visit | Attending: Family Medicine | Admitting: Family Medicine

## 2024-06-30 ENCOUNTER — Ambulatory Visit: Admitting: Family Medicine

## 2024-06-30 VITALS — BP 129/68 | HR 83 | Ht 69.0 in | Wt 331.1 lb

## 2024-06-30 DIAGNOSIS — Z202 Contact with and (suspected) exposure to infections with a predominantly sexual mode of transmission: Secondary | ICD-10-CM

## 2024-06-30 DIAGNOSIS — Z1331 Encounter for screening for depression: Secondary | ICD-10-CM

## 2024-06-30 DIAGNOSIS — Z01419 Encounter for gynecological examination (general) (routine) without abnormal findings: Secondary | ICD-10-CM

## 2024-06-30 LAB — CERVICOVAGINAL ANCILLARY ONLY
Bacterial Vaginitis (gardnerella): NEGATIVE
Candida Glabrata: NEGATIVE
Candida Vaginitis: NEGATIVE
Chlamydia: NEGATIVE
Comment: NEGATIVE
Comment: NEGATIVE
Comment: NEGATIVE
Comment: NEGATIVE
Comment: NEGATIVE
Comment: NORMAL
Neisseria Gonorrhea: NEGATIVE
Trichomonas: NEGATIVE

## 2024-06-30 NOTE — Progress Notes (Unsigned)
 Patient presents for Annual.  LMP: 03/29/2024 Last pap: 02/14/2022 Contraception: None Mammogram: Not yet indicated STD Screening: Accepts Flu Vaccine : N/A works for [employer]   CC:   Fun Fact: gad7 and psq9 done

## 2024-07-01 ENCOUNTER — Ambulatory Visit: Payer: Self-pay | Admitting: Family Medicine

## 2024-07-01 LAB — CYTOLOGY - PAP
Adequacy: ABSENT
Comment: NEGATIVE
Diagnosis: NEGATIVE
High risk HPV: NEGATIVE

## 2024-07-01 NOTE — Progress Notes (Signed)
 ANNUAL EXAM Patient name: Carol Porter MRN [id number]  Date of birth: Nov 06, 1990 Chief Complaint:   Annual Exam  History of Present Illness:   Carol Porter is a 33 y.o.  [id number]  female  being seen today for a routine annual exam.  Current complaints: still having the feeling of micro-abrasions on perineum. Seeing derm - getting skin testing.  Patient's last menstrual period was 03/29/2024 (approximate).    Last pap 01/2022. Results were: NILM w/ HRHPV negative. H/O abnormal pap: no Last mammogram: n/a     06/30/2024    9:30 AM 03/16/2024    3:00 PM  Depression screen PHQ 2/9  Decreased Interest 0 0  Down, Depressed, Hopeless 0 0  PHQ - 2 Score 0 0  Altered sleeping 0   Tired, decreased energy 0   Change in appetite 0   Feeling bad or failure about yourself  0   Trouble concentrating 0   Moving slowly or fidgety/restless 0   Suicidal thoughts 0   PHQ-9 Score 0         06/30/2024    9:31 AM  GAD 7 : Generalized Anxiety Score  Nervous, Anxious, on Edge 1  Control/stop worrying 0  Worry too much - different things 1  Trouble relaxing 0  Restless 0  Easily annoyed or irritable 1  Afraid - awful might happen 0  Total GAD 7 Score 3     Review of Systems:   Pertinent items are noted in HPI Denies any headaches, blurred vision, fatigue, shortness of breath, chest pain, abdominal pain, abnormal vaginal discharge/itching/odor/irritation, problems with periods, bowel movements, urination, or intercourse unless otherwise stated above. Pertinent History Reviewed:  Reviewed past medical,surgical, social and family history.  Reviewed problem list, medications and allergies. Physical Assessment:   Vitals:   06/30/24 0923  BP: 129/68  Pulse: 83  Weight: (!) 331 lb 1.3 oz (150.2 kg)  Height: 5' 9 (1.753 m)  Body mass index is 48.89 kg/m.        Physical Examination:   General appearance - well appearing, and in no distress  Mental status - alert, oriented to  person, place, and time  Psych:  She has a normal mood and affect  Skin - warm and dry, normal color, no suspicious lesions noted  Chest - effort normal, all lung fields clear to auscultation bilaterally  Heart - normal rate and regular rhythm  Neck:  midline trachea, no thyromegaly or nodules  Breasts - breasts appear normal, no suspicious masses, no skin or nipple changes or axillary nodes  Abdomen - soft, nontender, nondistended, no masses or organomegaly  Pelvic - VULVA: normal appearing vulva with no masses, tenderness or lesions  VAGINA: normal appearing vagina with normal color and discharge, no lesions  CERVIX: normal appearing cervix without discharge or lesions, no CMT  Thin prep pap is done with HR HPV cotesting  UTERUS: uterus is felt to be normal size, shape, consistency and nontender   ADNEXA: No adnexal masses or tenderness noted.  Extremities:  No swelling or varicosities noted  Chaperone present for exam  Assessment & Plan:  1. Encounter for well woman exam with routine gynecological exam (Primary) - Cytology - PAP  2. Encounter for assessment of sexually transmitted disease exposure - Cervicovaginal ancillary only( [place])    No orders of the defined types were placed in this encounter.   Meds: No orders of the defined types were placed in this encounter.   Follow-up: No follow-ups  on file.  Nayvie Lips J Kamaljit Hizer, DO 07/01/2024 12:00 PM

## 2024-07-04 ENCOUNTER — Other Ambulatory Visit (HOSPITAL_BASED_OUTPATIENT_CLINIC_OR_DEPARTMENT_OTHER): Payer: Self-pay

## 2024-07-08 ENCOUNTER — Ambulatory Visit

## 2024-07-08 DIAGNOSIS — M722 Plantar fascial fibromatosis: Secondary | ICD-10-CM

## 2024-07-08 DIAGNOSIS — K640 First degree hemorrhoids: Secondary | ICD-10-CM | POA: Diagnosis not present

## 2024-07-08 NOTE — Progress Notes (Signed)
 Patient presents today to be casted for custom molded orthotics. Dr. Thresa Sar has been treating patient for Plantar Fasciitis.   Impression foam cast was taken. ABN signed.  Patient info-  Shoe size: 10W  Shoe style: sneakers or boots  Height: 5'9  Weight: 330  Insurance: [employer]   Patient will be notified once orthotics arrive in office and reappoint for fitting at that time.

## 2024-07-13 ENCOUNTER — Telehealth: Payer: Self-pay

## 2024-07-13 NOTE — Telephone Encounter (Signed)
 Patient is calling in to request a second pair of Orthotics to be added on to the Px from 07/08/2024. She has spoken with insurance and will like to have a second pair added. The second pair she wants to be used for work boots.

## 2024-07-17 ENCOUNTER — Encounter

## 2024-07-24 NOTE — Patient Instructions (Signed)
" ° °  [address] [place] [address] Dept: [phone]    Diagnostics: NAC 80 patches placed NAC-80 (1-80)   1. Ammonium persulfate  2. [place] Balsam  3. Aluminum (III) chloride hexahydrate  4. 4-tert-Butylphenolformaldehyde resin (PTBP)  5. Bacitracin  6. Budesonide  7. Quaternium-15  8. Cinnamal  9. Cobalt(II) chloride hexahydrate  10. Colophonium  11. Methyldibromo glutaronitrile  12. Decyl Glucoside  13. Ethylenediamine dihydrochloride  14. 2-Hydroxyethyl methacrylate  15. Hydroperoxides of Linalool  16. Iodopropynyl butylcarbamate  17. 2-Mercaptobenzothiazole (MBT)  18. Thiuram mix  19. METHYLISOTHIAZOLINONE  20. Propylene glycol  21. 1,3-Diphenylguanidine  22. Hydroperoxides of Limonene  23. Black rubber mix  24. Carba mix  25. Fragrance mix I  26. Fragrance mix II  27. Textile dye mix II  28. Neomycin sulfate  29. Nickel(II) sulfate hexahydrate  30. p-Phenylenediamine (PPD)  31. Potassium dichromate  32. Propolis  33. Sodium Metabisulfite  34. Tixocortol-21-pivalate  35. Lanolin alcohol  36. Methylisothiazolinone + Methylchloroisothiazolinone  37. Cocamidopropyl betaine  38. 3-(Dimethylamino)-1-propylamine  39. Formaldehyde  40. Oleamidopropyl dimethylamine  41. 2-Bromo-2-Nitropropane-l,3-diol  42. Diazolidinyl urea  43. DMDM Hydantoin  44. Epoxy resin, Bisphenol A  45. Benzophenone-4  46. Imidazolidinyl urea  47. Lauryl polyglucose  48 Methyl methacrylate  49. Paraben mix  50. Mercapto mix  51. Caine mix III  52. Mixed dialkyl thiourea  53. Compositae mix II  54. Toluenesulfonamide formaldehyde resin  55. Tea Tree Oil oxidized  56. Ylang-Ylang oil  57. Amidoamine  58. Amerchol L 101  59. Benzocaine  60. Benzyl alchohol  61. Benzyl salicylate  62. Chloroxylenol (PCMX)  63. Cocamide DEA  64. Clobetasol-17-propionate  65. Toluene-2,5-Diamine sulfate  66. Ethyl acrylate  67. N-Isopropyl-N-phenyl--4-phenylenediamine (IPPD)  68. Lidocaine    69. Gold (I) sodium thiosulfate dihydrate  70. Sesquiterpene lactone mix  71. 2-n-Octyl-4-isothiazolin-3-one  72. Propyl gallate  73. Polymyxin B sulfate  74. Pramoxine hydrochloride  75. Sodium benzoate  76. Sorbitan oleate  77. Sorbitan sesquioleate  78. Tocopherol  79. BENZALKONIUM CHLORIDE  80. Chlorhexidine digluconate    Allergic contact dermatitis - Instructions provided on care of the patches for the next 48 hours. Seeley Southgate was instructed to avoid showering for the next 48 hours. Yamel Bale will follow up in 48 hours and 96 hours for patch readings.    Call the clinic if this treatment plan is not working well for you  Follow up in 2 days or sooner if needed. "

## 2024-07-24 NOTE — Progress Notes (Unsigned)
" ° °  [address] [place] [address] Dept: [phone]  Follow-up Note  RE: Chaela Branscum MRN: [id number] DOB: 05/18/1991 Date of Office Visit: 07/25/2024  Primary care provider: Claudene Rayfield HERO, MD Referring provider: Claudene Rayfield HERO, MD   Anslie returns to the office today for the patch test placement, given suspected history of contact dermatitis.  Patient reports feeling well overall with no steroid use over the last 4 weeks. Care of patches discussed in detail, specifically the need to keep patches dry and in place. All questions answered at this time.    Diagnostics: NAC 80 patches placed NAC-80 (1-80)   1. Ammonium persulfate  2. [place] Balsam  3. Aluminum (III) chloride hexahydrate  4. 4-tert-Butylphenolformaldehyde resin (PTBP)  5. Bacitracin  6. Budesonide  7. Quaternium-15  8. Cinnamal  9. Cobalt(II) chloride hexahydrate  10. Colophonium  11. Methyldibromo glutaronitrile  12. Decyl Glucoside  13. Ethylenediamine dihydrochloride  14. 2-Hydroxyethyl methacrylate  15. Hydroperoxides of Linalool  16. Iodopropynyl butylcarbamate  17. 2-Mercaptobenzothiazole (MBT)  18. Thiuram mix  19. METHYLISOTHIAZOLINONE  20. Propylene glycol  21. 1,3-Diphenylguanidine  22. Hydroperoxides of Limonene  23. Black rubber mix  24. Carba mix  25. Fragrance mix I  26. Fragrance mix II  27. Textile dye mix II  28. Neomycin sulfate  29. Nickel(II) sulfate hexahydrate  30. p-Phenylenediamine (PPD)  31. Potassium dichromate  32. Propolis  33. Sodium Metabisulfite  34. Tixocortol-21-pivalate  35. Lanolin alcohol  36. Methylisothiazolinone + Methylchloroisothiazolinone  37. Cocamidopropyl betaine  38. 3-(Dimethylamino)-1-propylamine  39. Formaldehyde  40. Oleamidopropyl dimethylamine  41. 2-Bromo-2-Nitropropane-l,3-diol  42. Diazolidinyl urea  43. DMDM Hydantoin  44. Epoxy resin, Bisphenol A  45. Benzophenone-4  46. Imidazolidinyl urea  47. Lauryl polyglucose  48 Methyl  methacrylate  49. Paraben mix  50. Mercapto mix  51. Caine mix III  52. Mixed dialkyl thiourea  53. Compositae mix II  54. Toluenesulfonamide formaldehyde resin  55. Tea Tree Oil oxidized  56. Ylang-Ylang oil  57. Amidoamine  58. Amerchol L 101  59. Benzocaine  60. Benzyl alchohol  61. Benzyl salicylate  62. Chloroxylenol (PCMX)  63. Cocamide DEA  64. Clobetasol-17-propionate  65. Toluene-2,5-Diamine sulfate  66. Ethyl acrylate  67. N-Isopropyl-N-phenyl--4-phenylenediamine (IPPD)  68. Lidocaine   69. Gold (I) sodium thiosulfate dihydrate  70. Sesquiterpene lactone mix  71. 2-n-Octyl-4-isothiazolin-3-one  72. Propyl gallate  73. Polymyxin B sulfate  74. Pramoxine hydrochloride  75. Sodium benzoate  76. Sorbitan oleate  77. Sorbitan sesquioleate  78. Tocopherol  79. BENZALKONIUM CHLORIDE  80. Chlorhexidine digluconate    Allergic contact dermatitis - Instructions provided on care of the patches for the next 48 hours. Katlynn Naser was instructed to avoid showering for the next 48 hours. Pammy Vesey will follow up in 48 hours and 96 hours for patch readings.    Call the clinic if this treatment plan is not working well for you  Follow up in 2 days or sooner if needed.  Thank you for the opportunity to care for this patient.  Please do not hesitate to contact me with questions.  Arlean Mutter, FNP [facility]  [facility]  "

## 2024-07-25 ENCOUNTER — Encounter: Payer: Self-pay | Admitting: Family Medicine

## 2024-07-25 ENCOUNTER — Ambulatory Visit: Admitting: Family Medicine

## 2024-07-25 DIAGNOSIS — L259 Unspecified contact dermatitis, unspecified cause: Secondary | ICD-10-CM | POA: Insufficient documentation

## 2024-07-25 DIAGNOSIS — L235 Allergic contact dermatitis due to other chemical products: Secondary | ICD-10-CM

## 2024-07-26 ENCOUNTER — Encounter

## 2024-07-26 NOTE — Progress Notes (Unsigned)
 "  Follow Up Note  RE: Carol Porter MRN: [id number] DOB: 08-Dec-1990 Date of Office Visit: 07/27/2024  Referring provider: Claudene Rayfield HERO, MD Primary care provider: Claudene Rayfield HERO, MD  History of Present Illness: I had the pleasure of seeing Carol Porter for a follow up visit at the [facility] on 07/27/2024. She is a 33 y.o. female, who is being followed for dermatitis. Today she is here for initial patch test interpretation, given suspected history of contact dermatitis.   Diagnostics:  NAC-80 Panel 48 hour reading:  Borderline to:  1,3-Diphenylguanidine Gold (I) sodium thiosulfate dihydrate  NAC-80 - 07/27/24 0900     NAC Reading Interval Day 3    NAC Panel Tested NAC-80 (1-80)    1. Ammonium persulfate Negative    2. [place] Balsam Negative    3. Aluminium (III) chloride hexahydrate Negative    4. 4-tert-Butylphenolformaldehyde resin (PTBP) Negative    5. Bacitracin Negative    6. Budesonide Negative    7. Quaternium-15 Negative    8. Cinnamal Negative    9. Cobalt(II) chloride hexahydrate Negative    10. Colophonium Negative    11. Methyldibromo glutaronitrile Negative    12. Decyl Glucoside Negative    13. Ethylenediamine dihydrochloride Negative    14. 2-Hydroxyethyl methacrylate Negative    15. Hydroperoxides of Linalool Negative    16. Iodopropynyl butylcarbamate Negative    17. 2-Mercaptobenzothiazole (MBT) Negative    18. Thiuram mix Negative    19. METHYLISOTHIAZOLINONE Negative    20. Propylene glycol Negative    21. 1,3-Diphenylguanidine --   +/-   22. Hydroperoxides of Limonene Negative    23. Black rubber mix Negative    24. Carba mix Negative    25. Fragrance mix I Negative    26. Fragrance mix II Negative    27. Textile dye mix II Negative    28. Neomycin sulfate Negative    29. Nickel(II) sulfate hexahydrate Negative    30. p-Phenylenediamine (PPD) Negative    31. Potassium dichromate Negative    32. Propolis Negative     33. Sodium Metabisulfite Negative    34. Tixocortol-21-pivalate Negative    35. Lanolin alcohol Negative    36. Methylisothiazolinone + Methylchloroisothiazolinone Negative    37. Cocamidopropyl betaine Negative    38. 3-(Dimethylamino)-1-propylamine Negative    39. Formaldehyde Negative    40. Oleamidopropyl dimethylamine Negative    41. 2-Bromo-2-Nitropropane-l,3-diol Negative    42. Diazolidinyl urea Negative    43. DMDM Hydantoin Negative    44. Epoxy resin, Bisphenol A Negative    45. Benzophenone-4 Negative    46. Imidazolidinyl urea Negative    47. Lauryl polyglucose Negative    48 Methyl methacrylate Negative    49. Paraben mix Negative    50. Mercapto mix Negative    51. Caine mix III Negative    52. Mixed dialkyl thiourea Negative    53. Compositae mix II Negative    54. Toluenesulfonamide formaldehyde resin Negative    55. Tea Tree Oil oxidized Negative    56. Ylang-Ylang oil Negative    57. Amidoamine Negative    58. Amerchol L 101 Negative    59. Benzocaine Negative    60. Benzyl alchohol Negative    61. Benzyl salicylate Negative    62. Chloroxylenol (PCMX) Negative    63. Cocamide DEA Negative    64. Clobetasol-17-propionate Negative    65. Toluene-2,5-Diamine sulfate Negative    66. Ethyl acrylate Negative  67. N-Isopropyl-N-phenyl--4-phenylenediamine (IPPD) Negative    68. Lidocaine  Negative    69. Gold (I) sodium thiosulfate dihydrate --   +/   70. Sesquiterpene lactone mix Negative    71. 2-n-Octyl-4-isothiazolin-3-one Negative    72. Propyl gallate Negative    73. Polymyxin B sulfate Negative    74. Pramoxine hydrochloride Negative    75. Sodium benzoate Negative    76. Sorbitan oleate Negative    77. Sorbitan sesquioleate Negative    78. Tocopherol Negative    79. BENZALKONIUM CHLORIDE Negative    80. Chlorhexidine digluconate Negative          Assessment and Plan: Carol Porter is a 33 y.o. female with: Allergic contact dermatitis due to other  agents Patches removed and 48 hour reading Borderline positive to:  1,3-Diphenylguanidine Gold (I) sodium thiosulfate dihydrate The patient has been provided detailed information regarding the substances she is sensitive to, as well as products containing the substances.  Meticulous avoidance of these substances is recommended.   Return in about 2 days (around 07/29/2024) for Patch reading.  It was my pleasure to see Carol Porter today and participate in her care. Please feel free to contact me with any questions or concerns.  Sincerely,  Orlan Cramp, DO Allergy  & Immunology  [facility]  [facility] office: [phone] [facility] office: [phone] "

## 2024-07-27 ENCOUNTER — Encounter: Payer: Self-pay | Admitting: Allergy

## 2024-07-27 ENCOUNTER — Ambulatory Visit (INDEPENDENT_AMBULATORY_CARE_PROVIDER_SITE_OTHER): Admitting: Allergy

## 2024-07-27 DIAGNOSIS — L2389 Allergic contact dermatitis due to other agents: Secondary | ICD-10-CM | POA: Diagnosis not present

## 2024-07-27 NOTE — Patient Instructions (Signed)
 Borderline positive to gold and diphenylguanidine.

## 2024-07-29 ENCOUNTER — Ambulatory Visit (INDEPENDENT_AMBULATORY_CARE_PROVIDER_SITE_OTHER): Admitting: Internal Medicine

## 2024-07-29 DIAGNOSIS — L235 Allergic contact dermatitis due to other chemical products: Secondary | ICD-10-CM

## 2024-07-29 NOTE — Progress Notes (Signed)
 "  Follow Up Note  RE: Carol Porter MRN: [id number] DOB: 24-Jan-1991 Date of Office Visit: 07/29/2024  Referring provider: Claudene Rayfield HERO, MD Primary care provider: Claudene Rayfield HERO, MD  History of Present Illness: I had the pleasure of seeing Carol Porter for a follow up visit at the [facility] on 07/29/2024. She is a 34 y.o. female, who is being followed for contact dermatitis. Today she is here for final patch test interpretation, given suspected history of contact dermatitis.   Diagnostics:   NAC 80 96-hour hour reading:  NAC Panel Tested NAC-80 (1-80)   1. Ammonium persulfate Negative   2. [place] Balsam Negative   3. Aluminum (III) chloride hexahydrate  Negative   4. 4-tert-Butylphenolformaldehyde resin (PTBP) Negative   5. Bacitracin Negative   6. Budesonide Negative   7. Quaternium-15 Negative   8. Cinnamal Negative   9. Cobalt(II) chloride hexahydrate Negative  10. Colophonium Negative   11. Methyldibromo glutaronitrile Negative   12. Decyl Glucoside Negative   13. Ethylenediamine dihydrochloride Negative   14. 2-Hydroxyethyl methacrylate Negative   15. Hydroperoxides of Linalool Negative   16. Iodopropynyl butylcarbamate Negative   17. 2-Mercaptobenzothiazole (MBT) Negative   18. Thiuram mix Negative   19. METHYLISOTHIAZOLINONE Negative   20. Propylene glycol Negative   21. 1,3-Diphenylguanidine Negative   22. Hydroperoxides of Limonene Negative   23. Black rubber mix Negative   24. Carba mix Negative   25. Fragrance mix I Negative   26. Fragrance mix II Negative   27. Textile dye mix II Negative   28. Neomycin sulfate Negative   29. Nickel(II) sulfate hexahydrate 2+  30. p-Phenylenediamine (PPD) Negative   31. Potassium dichromate Negative   32. Propolis Negative   33. Sodium Metabisulfite Negative   34. Tixocortol-21-pivalate Negative   35. Lanolin alcohol Negative   36. Methylisothiazolinone + Methylchloroisothiazolinone Negative    37. Cocamidopropyl betaine Negative   38. 3-(Dimethylamino)-1-propylamine Negative   39. Formaldehyde Negative  40. Oleamidopropyl dimethylamine Negative   41. 2-Bromo-2-Nitropropane-l,3-diol Negative   42. Diazolidinyl urea Negative  43. DMDM Hydantoin Negative   44. Epoxy resin, Bisphenol A Negative   45. Benzophenone-4 Negative   46. Imidazolidinyl urea Negative   47. Lauryl polyglucose Negative  48 Methyl methacrylate Negative   49. Paraben mix Negative   50. Mercapto mix Negative   51. Caine mix III Negative   52. Mixed dialkyl thiourea Negative   53. Compositae mix II Negative   54. Toluenesulfonamide formaldehyde resin Negative   55. Tea Tree Oil oxidized Negative   56. Ylang-Ylang oil Negative   57. Amidoamine Negative   58. Amerchol L 101 Negative   59. Benzocaine Negative   60. Benzyl alchohol Negative   61. Benzyl salicylate Negative   62. Chloroxylenol (PCMX) Negative   63. Cocamide DEA Negative   64. Clobetasol-17-propionate Negative   65. Toluene-2,5-Diamine sulfate Negative   66. Ethyl acrylate Negative   67. N-Isopropyl-N-phenyl--4-phenylenediamine (IPPD) Negative   68. Lidocaine  Negative   69. Gold (I) sodium thiosulfate dihydrate  2+   70. Sesquiterpene lactone mix Negative   71. 2-n-Octyl-4-isothiazolin-3-one Negative   72. Propyl gallate Negative   73. Polymyxin B sulfate Negative   74. Pramoxine hydrochloride Negative   75. Sodium benzoate Negative   76. Sorbitan oleate Negative   77. Sorbitan sesquioleate Negative   78. Tocopherol Negative   79. BENZALKONIUM CHLORIDE Negative   80. Chlorhexidine digluconate Negative      Assessment and Plan: Carol Porter is  a 34 y.o. female with: Concern for Contact Dermatitis: Will send safety product list.  Borderline positive to:  1,3-Diphenylguanidine  Positive to: Nickel(II) sulfate hexahydrate  Gold (I) sodium thiosulfate dihydrate  Use DPG free gloves- vinyl, accelerator free nitrile, polyethylene  The  patient has been provided detailed information regarding the substances she is sensitive to, as well as products containing the substances.  Meticulous avoidance of these substances is recommended. If avoidance is not possible, the use of barrier creams or lotions is recommended. If symptoms persist or progress despite meticulous avoidance of chemicals/substances above, dermatology evaluation may be warranted. No follow-ups on file.  It was my pleasure to see Carol Porter today and participate in her care. Please feel free to contact me with any questions or concerns.  Sincerely,   Arleta Blanch, MD [facility]   "

## 2024-07-29 NOTE — Patient Instructions (Addendum)
 Borderline positive to:  1,3-Diphenylguanidine  Positive to: Nickel(II) sulfate hexahydrate  Gold (I) sodium thiosulfate dihydrate

## 2024-08-02 ENCOUNTER — Encounter: Payer: Self-pay | Admitting: Internal Medicine

## 2024-08-02 ENCOUNTER — Other Ambulatory Visit (HOSPITAL_COMMUNITY): Payer: Self-pay

## 2024-08-02 ENCOUNTER — Other Ambulatory Visit (HOSPITAL_BASED_OUTPATIENT_CLINIC_OR_DEPARTMENT_OTHER): Payer: Self-pay

## 2024-08-03 ENCOUNTER — Encounter: Payer: Self-pay | Admitting: Internal Medicine

## 2024-08-03 ENCOUNTER — Other Ambulatory Visit: Payer: Self-pay

## 2024-08-12 ENCOUNTER — Telehealth: Payer: Self-pay | Admitting: Podiatry

## 2024-08-12 NOTE — Telephone Encounter (Signed)
 Ortho are in the [place] office. Spoke to pt and they are schedule to [facility] in [place]. Ortho are being sent to [facility] today.

## 2024-08-16 ENCOUNTER — Telehealth: Payer: Self-pay | Admitting: Podiatry

## 2024-08-16 NOTE — Telephone Encounter (Signed)
 Patient picked up orthotics in [place] 2 pairs

## 2024-08-17 ENCOUNTER — Encounter: Payer: Self-pay | Admitting: Internal Medicine

## 2024-08-22 ENCOUNTER — Encounter: Admitting: Family Medicine

## 2024-08-22 ENCOUNTER — Other Ambulatory Visit (HOSPITAL_BASED_OUTPATIENT_CLINIC_OR_DEPARTMENT_OTHER): Payer: Self-pay

## 2024-08-22 ENCOUNTER — Encounter (HOSPITAL_BASED_OUTPATIENT_CLINIC_OR_DEPARTMENT_OTHER): Payer: Self-pay

## 2024-08-24 ENCOUNTER — Encounter: Admitting: Internal Medicine

## 2024-08-26 ENCOUNTER — Encounter: Admitting: Family

## 2024-11-01 ENCOUNTER — Ambulatory Visit: Admitting: Dermatology
